# Patient Record
Sex: Female | Born: 1965 | Race: White | Hispanic: No | Smoking: Never smoker
Health system: Southern US, Community
[De-identification: ages and names within clinical notes are randomized; demographics above are authoritative.]

## PROBLEM LIST (undated history)

## (undated) DIAGNOSIS — K219 Gastro-esophageal reflux disease without esophagitis: Secondary | ICD-10-CM

## (undated) DIAGNOSIS — F419 Anxiety disorder, unspecified: Secondary | ICD-10-CM

## (undated) DIAGNOSIS — R112 Nausea with vomiting, unspecified: Secondary | ICD-10-CM

## (undated) DIAGNOSIS — E538 Deficiency of other specified B group vitamins: Secondary | ICD-10-CM

## (undated) DIAGNOSIS — T8859XA Other complications of anesthesia, initial encounter: Secondary | ICD-10-CM

## (undated) DIAGNOSIS — T4145XA Adverse effect of unspecified anesthetic, initial encounter: Secondary | ICD-10-CM

## (undated) DIAGNOSIS — Z9889 Other specified postprocedural states: Secondary | ICD-10-CM

## (undated) DIAGNOSIS — M1990 Unspecified osteoarthritis, unspecified site: Secondary | ICD-10-CM

## (undated) HISTORY — PX: NASAL SINUS SURGERY: SHX719

## (undated) HISTORY — PX: THYROID CYST EXCISION: SHX2511

## (undated) HISTORY — PX: UTERINE FIBROID SURGERY: SHX826

## (undated) HISTORY — DX: Deficiency of other specified B group vitamins: E53.8

---

## 2013-06-11 DIAGNOSIS — R3989 Other symptoms and signs involving the genitourinary system: Secondary | ICD-10-CM | POA: Insufficient documentation

## 2013-11-06 ENCOUNTER — Ambulatory Visit: Payer: Self-pay | Admitting: Physical Medicine and Rehabilitation

## 2015-03-16 DIAGNOSIS — E538 Deficiency of other specified B group vitamins: Secondary | ICD-10-CM | POA: Insufficient documentation

## 2015-08-10 ENCOUNTER — Other Ambulatory Visit: Payer: Self-pay | Admitting: Physical Medicine and Rehabilitation

## 2015-08-10 DIAGNOSIS — M5416 Radiculopathy, lumbar region: Secondary | ICD-10-CM

## 2015-08-18 ENCOUNTER — Ambulatory Visit
Admission: RE | Admit: 2015-08-18 | Discharge: 2015-08-18 | Disposition: A | Discharge status: Home / Self Care | Payer: BLUE CROSS/BLUE SHIELD | Source: Ambulatory Visit | Attending: Physical Medicine and Rehabilitation | Admitting: Physical Medicine and Rehabilitation

## 2015-08-18 DIAGNOSIS — M5126 Other intervertebral disc displacement, lumbar region: Secondary | ICD-10-CM | POA: Insufficient documentation

## 2015-08-18 DIAGNOSIS — M4696 Unspecified inflammatory spondylopathy, lumbar region: Secondary | ICD-10-CM | POA: Insufficient documentation

## 2015-08-18 DIAGNOSIS — M5416 Radiculopathy, lumbar region: Secondary | ICD-10-CM | POA: Insufficient documentation

## 2016-03-31 DIAGNOSIS — R7989 Other specified abnormal findings of blood chemistry: Secondary | ICD-10-CM | POA: Insufficient documentation

## 2016-06-30 DIAGNOSIS — N3281 Overactive bladder: Secondary | ICD-10-CM | POA: Insufficient documentation

## 2016-07-02 DIAGNOSIS — R3129 Other microscopic hematuria: Secondary | ICD-10-CM | POA: Insufficient documentation

## 2017-08-01 DIAGNOSIS — M5136 Other intervertebral disc degeneration, lumbar region: Secondary | ICD-10-CM | POA: Insufficient documentation

## 2018-03-12 ENCOUNTER — Other Ambulatory Visit: Payer: Self-pay | Admitting: Orthopedic Surgery

## 2018-03-12 DIAGNOSIS — M25561 Pain in right knee: Secondary | ICD-10-CM

## 2018-03-21 ENCOUNTER — Ambulatory Visit
Admission: RE | Admit: 2018-03-21 | Discharge: 2018-03-21 | Disposition: A | Discharge status: Home / Self Care | Payer: BLUE CROSS/BLUE SHIELD | Source: Ambulatory Visit | Attending: Orthopedic Surgery | Admitting: Orthopedic Surgery

## 2018-03-21 DIAGNOSIS — M1711 Unilateral primary osteoarthritis, right knee: Secondary | ICD-10-CM | POA: Insufficient documentation

## 2018-03-21 DIAGNOSIS — M25561 Pain in right knee: Secondary | ICD-10-CM | POA: Diagnosis not present

## 2018-05-23 ENCOUNTER — Other Ambulatory Visit: Payer: Self-pay

## 2018-05-23 ENCOUNTER — Encounter
Admission: RE | Admit: 2018-05-23 | Discharge: 2018-05-23 | Disposition: A | Discharge status: Home / Self Care | Payer: BLUE CROSS/BLUE SHIELD | Source: Ambulatory Visit | Attending: Orthopedic Surgery | Admitting: Orthopedic Surgery

## 2018-05-23 HISTORY — DX: Other complications of anesthesia, initial encounter: T88.59XA

## 2018-05-23 HISTORY — DX: Nausea with vomiting, unspecified: R11.2

## 2018-05-23 HISTORY — DX: Gastro-esophageal reflux disease without esophagitis: K21.9

## 2018-05-23 HISTORY — DX: Other specified postprocedural states: Z98.890

## 2018-05-23 HISTORY — DX: Unspecified osteoarthritis, unspecified site: M19.90

## 2018-05-23 HISTORY — DX: Adverse effect of unspecified anesthetic, initial encounter: T41.45XA

## 2018-05-23 HISTORY — DX: Anxiety disorder, unspecified: F41.9

## 2018-05-23 NOTE — Patient Instructions (Signed)
Your procedure is scheduled on: 05-30-18  Report to Same Day Surgery 2nd floor medical mall [facility] Entrance-take elevator on left to 2nd floor.  Check in with surgery information desk.) To find out your arrival time please call [phone] between 1PM - 3PM on 05-29-18  Remember: Instructions that are not followed completely may result in serious medical risk, up to and including death, or upon the discretion of your surgeon and anesthesiologist your surgery may need to be rescheduled.    _x___ 1. Do not eat food after midnight the night before your procedure. You may drink clear liquids up to 2 hours before you are scheduled to arrive at the hospital for your procedure.  Do not drink clear liquids within 2 hours of your scheduled arrival to the hospital.  Clear liquids include  --Water or Apple juice without pulp  --Clear carbohydrate beverage such as ClearFast or Gatorade  --Black Coffee or Clear Tea (No milk, no creamers, do not add anything to the coffee or Tea   No gum chewing or hard candies.     __x__ 2. No Alcohol for 24 hours before or after surgery.   __x__3. No Smoking or e-cigarettes for 24 prior to surgery.  Do not use any chewable tobacco products for at least 6 hour prior to surgery   ____  4. Bring all medications with you on the day of surgery if instructed.    __x__ 5. Notify your doctor if there is any change in your medical condition     (cold, fever, infections).    x___6. On the morning of surgery brush your teeth with toothpaste and water.  You may rinse your mouth with mouth wash if you wish.  Do not swallow any toothpaste or mouthwash.   Do not wear jewelry, make-up, hairpins, clips or nail polish.  Do not wear lotions, powders, or perfumes. You may wear deodorant.  Do not shave 48 hours prior to surgery. Men may shave face and neck.  Do not bring valuables to the hospital.    [facility] is not responsible for any belongings or valuables.      Contacts, dentures or bridgework may not be worn into surgery.  Leave your suitcase in the car. After surgery it may be brought to your room.  For patients admitted to the hospital, discharge time is determined by your treatment team.  _  Patients discharged the day of surgery will not be allowed to drive home.  You will need someone to drive you home and stay with you the night of your procedure.    Please read over the following fact sheets that you were given:   [facility] Preparing for Surgery and or MRSA Information   _x___ TAKE THE FOLLOWING MEDICATION THE MORNING OF SURGERY WITH A SMALL SIP OF WATER. These include:  1. WELLBUTRIN  2. ZANTAC  3.TAKE A ZANTAC THE NIGHT BEFORE YOUR SURGERY  4.  5.  6.  ____Fleets enema or Magnesium Citrate as directed.   ____ Use CHG Soap or sage wipes as directed on instruction sheet   ____ Use inhalers on the day of surgery and bring to hospital day of surgery  ____ Stop Metformin and Janumet 2 days prior to surgery.    ____ Take 1/2 of usual insulin dose the night before surgery and none on the morning surgery.   ____ Follow recommendations from Cardiologist, Pulmonologist or PCP regarding stopping Aspirin, Coumadin, Plavix ,Eliquis, Effient, or Pradaxa, and Pletal.  X____Stop Anti-inflammatories such as Advil, Aleve, Ibuprofen, Motrin, Naproxen, Naprosyn, Goodies powders or aspirin products NOW-OK to take Tylenol    _x___ Stop supplements until after surgery-STOP PHENTERMINE AND MELATONIN NOW-MAY RESUME AFTER SURGERY   ____ Bring C-Pap to the hospital.

## 2018-05-29 ENCOUNTER — Encounter: Payer: Self-pay | Admitting: *Deleted

## 2018-05-29 MED ORDER — CEFAZOLIN SODIUM-DEXTROSE 2-4 GM/100ML-% IV SOLN
2.0000 g | Freq: Once | INTRAVENOUS | Status: AC
  Administered 2018-05-30: 2 g via INTRAVENOUS

## 2018-05-30 ENCOUNTER — Ambulatory Visit: Payer: BLUE CROSS/BLUE SHIELD | Admitting: Certified Registered"

## 2018-05-30 ENCOUNTER — Encounter
Admission: RE | Disposition: A | Discharge status: Home / Self Care | Payer: Self-pay | Source: Ambulatory Visit | Attending: Orthopedic Surgery

## 2018-05-30 ENCOUNTER — Ambulatory Visit
Admission: RE | Admit: 2018-05-30 | Discharge: 2018-05-30 | Disposition: A | Discharge status: Home / Self Care | Payer: BLUE CROSS/BLUE SHIELD | Source: Ambulatory Visit | Attending: Orthopedic Surgery | Admitting: Orthopedic Surgery

## 2018-05-30 DIAGNOSIS — Z79899 Other long term (current) drug therapy: Secondary | ICD-10-CM | POA: Diagnosis not present

## 2018-05-30 DIAGNOSIS — S83281A Other tear of lateral meniscus, current injury, right knee, initial encounter: Secondary | ICD-10-CM | POA: Insufficient documentation

## 2018-05-30 DIAGNOSIS — Y929 Unspecified place or not applicable: Secondary | ICD-10-CM | POA: Insufficient documentation

## 2018-05-30 DIAGNOSIS — M1711 Unilateral primary osteoarthritis, right knee: Secondary | ICD-10-CM | POA: Diagnosis not present

## 2018-05-30 DIAGNOSIS — S83241A Other tear of medial meniscus, current injury, right knee, initial encounter: Secondary | ICD-10-CM | POA: Diagnosis not present

## 2018-05-30 DIAGNOSIS — X58XXXA Exposure to other specified factors, initial encounter: Secondary | ICD-10-CM | POA: Diagnosis not present

## 2018-05-30 DIAGNOSIS — E538 Deficiency of other specified B group vitamins: Secondary | ICD-10-CM | POA: Diagnosis not present

## 2018-05-30 DIAGNOSIS — M5136 Other intervertebral disc degeneration, lumbar region: Secondary | ICD-10-CM | POA: Insufficient documentation

## 2018-05-30 HISTORY — PX: KNEE ARTHROSCOPY WITH MEDIAL MENISECTOMY: SHX5651

## 2018-05-30 LAB — POCT PREGNANCY, URINE: Preg Test, Ur: NEGATIVE

## 2018-05-30 SURGERY — ARTHROSCOPY, KNEE, WITH MEDIAL MENISCECTOMY
Anesthesia: General | Site: Knee | Laterality: Right | Wound class: "Clean "

## 2018-05-30 MED ORDER — FENTANYL CITRATE (PF) 100 MCG/2ML IJ SOLN
25.0000 ug | INTRAMUSCULAR | Status: AC | PRN
  Administered 2018-05-30 (×6): 25 ug via INTRAVENOUS

## 2018-05-30 MED ORDER — SCOPOLAMINE 1 MG/3DAYS TD PT72
1.0000 | MEDICATED_PATCH | TRANSDERMAL | Status: DC
  Administered 2018-05-30: 1.5 mg via TRANSDERMAL

## 2018-05-30 MED ORDER — FENTANYL CITRATE (PF) 100 MCG/2ML IJ SOLN
INTRAMUSCULAR | Status: AC
  Administered 2018-05-30: 25 ug via INTRAVENOUS
  Filled 2018-05-30: qty 2

## 2018-05-30 MED ORDER — HYDROCODONE-ACETAMINOPHEN 5-325 MG PO TABS
ORAL_TABLET | ORAL | Status: AC
  Filled 2018-05-30: qty 1

## 2018-05-30 MED ORDER — MIDAZOLAM HCL 5 MG/5ML IJ SOLN
INTRAMUSCULAR | Status: DC | PRN
  Administered 2018-05-30: 2 mg via INTRAVENOUS

## 2018-05-30 MED ORDER — SCOPOLAMINE 1 MG/3DAYS TD PT72
MEDICATED_PATCH | TRANSDERMAL | Status: AC
  Administered 2018-05-30: 1.5 mg via TRANSDERMAL
  Filled 2018-05-30: qty 1

## 2018-05-30 MED ORDER — GLYCOPYRROLATE 0.2 MG/ML IJ SOLN
INTRAMUSCULAR | Status: AC
  Filled 2018-05-30: qty 1

## 2018-05-30 MED ORDER — PROPOFOL 10 MG/ML IV BOLUS
INTRAVENOUS | Status: DC | PRN
  Administered 2018-05-30 (×2): 200 mg via INTRAVENOUS

## 2018-05-30 MED ORDER — CEFAZOLIN SODIUM-DEXTROSE 2-4 GM/100ML-% IV SOLN
INTRAVENOUS | Status: AC
  Filled 2018-05-30: qty 100

## 2018-05-30 MED ORDER — DIPHENHYDRAMINE HCL 50 MG/ML IJ SOLN
INTRAMUSCULAR | Status: AC
  Filled 2018-05-30: qty 1

## 2018-05-30 MED ORDER — FENTANYL CITRATE (PF) 100 MCG/2ML IJ SOLN
INTRAMUSCULAR | Status: AC
  Filled 2018-05-30: qty 2

## 2018-05-30 MED ORDER — ONDANSETRON HCL 4 MG/2ML IJ SOLN
INTRAMUSCULAR | Status: DC | PRN
  Administered 2018-05-30: 4 mg via INTRAVENOUS

## 2018-05-30 MED ORDER — HYDROCODONE-ACETAMINOPHEN 5-325 MG PO TABS: 1.0000 | ORAL_TABLET | Freq: Four times a day (QID) | ORAL | 0 refills | Status: DC | PRN

## 2018-05-30 MED ORDER — HYDROCODONE-ACETAMINOPHEN 5-325 MG PO TABS
1.0000 | ORAL_TABLET | Freq: Once | ORAL | Status: AC
  Administered 2018-05-30: 1 via ORAL

## 2018-05-30 MED ORDER — LIDOCAINE HCL (PF) 2 % IJ SOLN
INTRAMUSCULAR | Status: DC | PRN
  Administered 2018-05-30: 50 mg

## 2018-05-30 MED ORDER — DEXAMETHASONE SODIUM PHOSPHATE 10 MG/ML IJ SOLN
INTRAMUSCULAR | Status: AC
  Filled 2018-05-30: qty 1

## 2018-05-30 MED ORDER — ONDANSETRON HCL 4 MG/2ML IJ SOLN
INTRAMUSCULAR | Status: AC
Start: 2018-05-30 — End: ?
  Filled 2018-05-30: qty 2

## 2018-05-30 MED ORDER — LACTATED RINGERS IV SOLN
INTRAVENOUS | Status: DC
  Administered 2018-05-30: 10:00:00 via INTRAVENOUS

## 2018-05-30 MED ORDER — DEXAMETHASONE SODIUM PHOSPHATE 10 MG/ML IJ SOLN
INTRAMUSCULAR | Status: DC | PRN
  Administered 2018-05-30: 10 mg via INTRAVENOUS

## 2018-05-30 MED ORDER — GLYCOPYRROLATE 0.2 MG/ML IJ SOLN
INTRAMUSCULAR | Status: DC | PRN
  Administered 2018-05-30: 0.2 mg via INTRAVENOUS

## 2018-05-30 MED ORDER — MIDAZOLAM HCL 2 MG/2ML IJ SOLN
INTRAMUSCULAR | Status: AC
  Filled 2018-05-30: qty 2

## 2018-05-30 MED ORDER — ONDANSETRON HCL 4 MG/2ML IJ SOLN: 4.0000 mg | Freq: Once | INTRAMUSCULAR | Status: DC | PRN

## 2018-05-30 MED ORDER — FENTANYL CITRATE (PF) 100 MCG/2ML IJ SOLN
INTRAMUSCULAR | Status: DC | PRN
  Administered 2018-05-30 (×2): 50 ug via INTRAVENOUS

## 2018-05-30 MED ORDER — KETOROLAC TROMETHAMINE 30 MG/ML IJ SOLN
INTRAMUSCULAR | Status: DC | PRN
  Administered 2018-05-30: 30 mg via INTRAVENOUS

## 2018-05-30 MED ORDER — BUPIVACAINE-EPINEPHRINE (PF) 0.5% -1:200000 IJ SOLN
INTRAMUSCULAR | Status: DC | PRN
  Administered 2018-05-30: 10 mL via PERINEURAL

## 2018-05-30 SURGICAL SUPPLY — 30 items
ABLATOR ASPIRATE 50D MULTI-PRT (SURGICAL WAND) ×2 IMPLANT
BANDAGE ACE 4X5 VEL STRL LF (GAUZE/BANDAGES/DRESSINGS) ×2 IMPLANT
BLADE INCISOR PLUS 4.5 (BLADE) IMPLANT
CHLORAPREP W/TINT 26ML (MISCELLANEOUS) ×3 IMPLANT
CUFF TOURN 24 STER (MISCELLANEOUS) ×2 IMPLANT
CUFF TOURN 30 STER DUAL PORT (MISCELLANEOUS) ×2 IMPLANT
DW OUTFLOW CASSETTE/TUBE SET (MISCELLANEOUS) ×2 IMPLANT
GAUZE SPONGE 4X4 12PLY STRL (GAUZE/BANDAGES/DRESSINGS) ×3 IMPLANT
GLOVE SURG SYN 9.0  PF PI (GLOVE) ×2
GLOVE SURG SYN 9.0 PF PI (GLOVE) ×1 IMPLANT
GOWN SRG 2XL LVL 4 RGLN SLV (GOWNS) ×1 IMPLANT
GOWN STRL NON-REIN 2XL LVL4 (GOWNS) ×2
GOWN STRL REUS W/ TWL LRG LVL3 (GOWN DISPOSABLE) ×2 IMPLANT
GOWN STRL REUS W/TWL LRG LVL3 (GOWN DISPOSABLE) ×4
IV LACTATED RINGER IRRG 3000ML (IV SOLUTION) ×4
IV LR IRRIG 3000ML ARTHROMATIC (IV SOLUTION) ×2 IMPLANT
KIT TURNOVER KIT A (KITS) ×3 IMPLANT
MANIFOLD NEPTUNE II (INSTRUMENTS) ×3 IMPLANT
NEEDLE HYPO 22GX1.5 SAFETY (NEEDLE) ×3 IMPLANT
PACK ARTHROSCOPY KNEE (MISCELLANEOUS) ×3 IMPLANT
SCALPEL PROTECTED #11 DISP (BLADE) ×3 IMPLANT
SET TUBE SUCT SHAVER OUTFL 24K (TUBING) ×1 IMPLANT
SET TUBE TIP INTRA-ARTICULAR (MISCELLANEOUS) ×1 IMPLANT
SUT ETHILON 4-0 (SUTURE) ×2
SUT ETHILON 4-0 FS2 18XMFL BLK (SUTURE) ×1
SUTURE ETHLN 4-0 FS2 18XMF BLK (SUTURE) ×1 IMPLANT
TUBING ARTHRO INFLOW-ONLY STRL (TUBING) ×1 IMPLANT
TUBING REDEUCE PAT W/CON 8IN (MISCELLANEOUS) ×2 IMPLANT
TUBING REDEUCE PUMP W/CON 8IN (MISCELLANEOUS) ×2 IMPLANT
WAND COBLATION FLOW 50 (SURGICAL WAND) ×1 IMPLANT

## 2018-05-30 NOTE — Anesthesia Post-op Follow-up Note (Signed)
Anesthesia QCDR form completed.        

## 2018-05-30 NOTE — Transfer of Care (Signed)
Immediate Anesthesia Transfer of Care Note  Patient: Kelsey Short  Procedure(s) Performed: KNEE ARTHROSCOPY WITH MEDIAL MENISECTOMY PARTIAL SYNOVECTOMY (Right Knee)  Patient Location: PACU  Anesthesia Type:General  Level of Consciousness: sedated  Airway & Oxygen Therapy: Patient Spontanous Breathing and Patient connected to face mask oxygen  Post-op Assessment: Report given to RN and Post -op Vital signs reviewed and stable  Post vital signs: Reviewed  Last Vitals:  Vitals Value Taken Time  BP 107/70 05/30/2018 11:34 AM  Temp 36.1 C 05/30/2018 11:34 AM  Pulse 84 05/30/2018 11:34 AM  Resp 14 05/30/2018 11:34 AM  SpO2 97 % 05/30/2018 11:34 AM    Last Pain:  Vitals:   05/30/18 0939  TempSrc: Temporal  PainSc: 3          Complications: No apparent anesthesia complications

## 2018-05-30 NOTE — Op Note (Signed)
05/30/2018  11:31 AM  PATIENT:  Kelsey Short  52 y.o. female  PRE-OPERATIVE DIAGNOSIS:  DERANGEMENT OF POSTERIOR HORN OF MEDIAL MENISCUS  POST-OPERATIVE DIAGNOSIS:  DERANGEMENT OF POSTERIOR HORN OF MEDIAL MENISCUS, anterior horn tear lateral meniscus and plica band, degenerative arthritis  PROCEDURE:  Procedure(s): KNEE ARTHROSCOPY WITH MEDIAL MENISECTOMY PARTIAL SYNOVECTOMY (Right) partial lateral meniscectomy  SURGEON: Laurene Footman, MD  ASSISTANTS: None  ANESTHESIA:   general  EBL:  Total I/O In: 600 [I.V.:600] Out: -   BLOOD ADMINISTERED:none  DRAINS: none   LOCAL MEDICATIONS USED:  MARCAINE     SPECIMEN:  No Specimen  DISPOSITION OF SPECIMEN:  N/A  COUNTS:  YES  TOURNIQUET:   Total Tourniquet Time Documented: Thigh (Right) - 31 minutes Total: Thigh (Right) - 31 minutes   IMPLANTS: None  DICTATION: .Dragon Dictation patient was brought to the operating room and after adequate anesthesia was obtained the right leg was prepped and draped you sterile fashion with a tourniquet and arthroscopic leg holder applied.  After patient identification and timeout procedures were completed inferior lateral portal was made with tourniquet elevated.  Initial inspection revealed significant chondromalacia of the patella particularly in the central aspect and medial facet with several small loose fragments of articular cartilage not large enough to be considered loose bodies.  There is mild synovitis within the knee and a thick plica band medially coming on the medial compartment the medial femoral condyle had significant articular loss with some fissuring to the bone.  No exposed bone.  There was a flap tear of the posterior horn of the meniscus which was subsequently ablated with the wand.  There was a very thickened ligamentum mucosa and moderate synovitis in the notch so this was ablated as well.  The lateral compartment the articular cartilage was near normal but there were  small tears to the anterior horn and a small flap tear of the posterior which did appear to impinge into the joint was told these also were ablated.  After the meniscus pathology was addressed the gutters were checked and there were no loose bodies or plica band was ablated at this time with pre-and post procedure check pictures obtained the knee was thoroughly irrigated all instrumentation withdrawn.  Wounds were closed with simple 4-0 nylon and the knee incisions were infiltrated with a total of 20 cc half percent Sensorcaine with epinephrine for postop analgesia.  Xeroform 4 x 4 web roll and Ace wrap applied.  PLAN OF CARE: Discharge to home after PACU  PATIENT DISPOSITION:  PACU - hemodynamically stable.

## 2018-05-30 NOTE — Anesthesia Preprocedure Evaluation (Signed)
Anesthesia Evaluation  Patient identified by MRN, date of birth, ID band Patient awake    Reviewed: Allergy & Precautions, NPO status , Patient's Chart, lab work & pertinent test results  History of Anesthesia Complications (+) PONV and history of anesthetic complications  Airway Mallampati: II  TM Distance: >3 FB     Dental  (+) Caps, Teeth Intact   Pulmonary neg pulmonary ROS,    Pulmonary exam normal        Cardiovascular negative cardio ROS Normal cardiovascular exam     Neuro/Psych Anxiety negative neurological ROS     GI/Hepatic Neg liver ROS, GERD  Medicated and Controlled,  Endo/Other  negative endocrine ROS  Renal/GU negative Renal ROS     Musculoskeletal  (+) Arthritis , Osteoarthritis,    Abdominal Normal abdominal exam  (+)   Peds  Hematology negative hematology ROS (+)   Anesthesia Other Findings   Reproductive/Obstetrics                             Anesthesia Physical Anesthesia Plan  ASA: II  Anesthesia Plan: General   Post-op Pain Management:    Induction: Intravenous  PONV Risk Score and Plan: 4 or greater  Airway Management Planned: LMA  Additional Equipment:   Intra-op Plan:   Post-operative Plan: Extubation in OR  Informed Consent: I have reviewed the patients History and Physical, chart, labs and discussed the procedure including the risks, benefits and alternatives for the proposed anesthesia with the patient or authorized representative who has indicated his/her understanding and acceptance.   Dental advisory given  Plan Discussed with: CRNA and Surgeon  Anesthesia Plan Comments:         Anesthesia Quick Evaluation

## 2018-05-30 NOTE — H&P (Signed)
Reviewed paper H+P, will be scanned into chart. No changes noted.  

## 2018-05-30 NOTE — Anesthesia Procedure Notes (Signed)
Procedure Name: LMA Insertion Performed by: Debany Vantol, CRNA Pre-anesthesia Checklist: Patient identified, Patient being monitored, Timeout performed, Emergency Drugs available and Suction available Patient Re-evaluated:Patient Re-evaluated prior to induction Oxygen Delivery Method: Circle system utilized Preoxygenation: Pre-oxygenation with 100% oxygen Induction Type: IV induction Ventilation: Mask ventilation without difficulty LMA: LMA inserted LMA Size: 4.0 Tube type: Oral Number of attempts: 2 Placement Confirmation: positive ETCO2 and breath sounds checked- equal and bilateral Tube secured with: Tape Dental Injury: Teeth and Oropharynx as per pre-operative assessment        

## 2018-05-30 NOTE — Discharge Instructions (Addendum)
Weightbearing as tolerated on right leg, try to minimize activity through the weekend.  Aspirin 325 mg daily for 1 month.  Medicine as directed.  Keep dressing clean and dry  AMBULATORY SURGERY  DISCHARGE INSTRUCTIONS   1) The drugs that you were given will stay in your system until tomorrow so for the next 24 hours you should not:  A) Drive an automobile B) Make any legal decisions C) Drink any alcoholic beverage   2) You may resume regular meals tomorrow.  Today it is better to start with liquids and gradually work up to solid foods.  You may eat anything you prefer, but it is better to start with liquids, then soup and crackers, and gradually work up to solid foods.   3) Please notify your doctor immediately if you have any unusual bleeding, trouble breathing, redness and pain at the surgery site, drainage, fever, or pain not relieved by medication.    4) Additional Instructions:        Please contact your physician with any problems or Same Day Surgery at [phone], Monday through Friday 6 am to 4 pm, or [place] at [facility] number at [phone].

## 2018-05-31 NOTE — Anesthesia Postprocedure Evaluation (Signed)
Anesthesia Post Note  Patient: Kelsey Short  Procedure(s) Performed: KNEE ARTHROSCOPY WITH MEDIAL MENISECTOMY PARTIAL SYNOVECTOMY (Right Knee)  Patient location during evaluation: PACU Anesthesia Type: General Level of consciousness: awake and alert and oriented Pain management: pain level controlled Vital Signs Assessment: post-procedure vital signs reviewed and stable Respiratory status: spontaneous breathing Cardiovascular status: blood pressure returned to baseline Anesthetic complications: no     Last Vitals:  Vitals:   05/30/18 1255 05/30/18 1301  BP: (!) 148/76 (!) 145/70  Pulse: 80 80  Resp: 15 16  Temp:  36.8 C  SpO2: 98% 98%    Last Pain:  Vitals:   05/31/18 0844  TempSrc:   PainSc: 2                  Evadean Sproule

## 2018-06-06 ENCOUNTER — Other Ambulatory Visit: Payer: BLUE CROSS/BLUE SHIELD

## 2018-08-06 ENCOUNTER — Other Ambulatory Visit: Payer: Self-pay | Admitting: Internal Medicine

## 2018-08-06 DIAGNOSIS — M1711 Unilateral primary osteoarthritis, right knee: Secondary | ICD-10-CM | POA: Insufficient documentation

## 2018-08-06 DIAGNOSIS — Z1231 Encounter for screening mammogram for malignant neoplasm of breast: Secondary | ICD-10-CM

## 2018-09-11 DIAGNOSIS — Z8262 Family history of osteoporosis: Secondary | ICD-10-CM | POA: Insufficient documentation

## 2019-01-10 DIAGNOSIS — M25561 Pain in right knee: Secondary | ICD-10-CM | POA: Insufficient documentation

## 2019-03-04 DIAGNOSIS — M179 Osteoarthritis of knee, unspecified: Secondary | ICD-10-CM | POA: Insufficient documentation

## 2019-03-04 DIAGNOSIS — M1710 Unilateral primary osteoarthritis, unspecified knee: Secondary | ICD-10-CM | POA: Insufficient documentation

## 2019-09-16 ENCOUNTER — Other Ambulatory Visit: Payer: Self-pay

## 2019-09-16 DIAGNOSIS — R3129 Other microscopic hematuria: Secondary | ICD-10-CM

## 2019-09-19 ENCOUNTER — Ambulatory Visit: Payer: Self-pay | Admitting: Urology

## 2019-09-19 ENCOUNTER — Encounter

## 2019-10-08 ENCOUNTER — Ambulatory Visit: Payer: Self-pay | Admitting: Urology

## 2019-10-21 DIAGNOSIS — M1711 Unilateral primary osteoarthritis, right knee: Secondary | ICD-10-CM | POA: Diagnosis not present

## 2019-11-04 ENCOUNTER — Other Ambulatory Visit: Payer: Self-pay

## 2019-11-04 DIAGNOSIS — R3129 Other microscopic hematuria: Secondary | ICD-10-CM

## 2019-11-07 ENCOUNTER — Other Ambulatory Visit: Payer: Self-pay

## 2019-11-07 ENCOUNTER — Ambulatory Visit (INDEPENDENT_AMBULATORY_CARE_PROVIDER_SITE_OTHER): Payer: 59 | Admitting: Urology

## 2019-11-07 ENCOUNTER — Other Ambulatory Visit
Admission: RE | Admit: 2019-11-07 | Discharge: 2019-11-07 | Disposition: A | Discharge status: Home / Self Care | Payer: 59 | Attending: Urology | Admitting: Urology

## 2019-11-07 ENCOUNTER — Encounter: Payer: Self-pay | Admitting: Urology

## 2019-11-07 VITALS — BP 115/71 | HR 81 | Ht 74.0 in | Wt 187.0 lb

## 2019-11-07 DIAGNOSIS — R3129 Other microscopic hematuria: Secondary | ICD-10-CM | POA: Diagnosis not present

## 2019-11-07 LAB — URINALYSIS, COMPLETE (UACMP) WITH MICROSCOPIC
Bacteria, UA: NONE SEEN
Bilirubin Urine: NEGATIVE
Glucose, UA: NEGATIVE mg/dL
Ketones, ur: NEGATIVE mg/dL
Leukocytes,Ua: NEGATIVE
Nitrite: NEGATIVE
Protein, ur: NEGATIVE mg/dL
Specific Gravity, Urine: 1.025 (ref 1.005–1.030)
Squamous Epithelial / LPF: NONE SEEN (ref 0–5)
WBC, UA: NONE SEEN WBC/hpf (ref 0–5)
pH: 6 (ref 5.0–8.0)

## 2019-11-07 NOTE — Progress Notes (Signed)
11/07/2019 2:30 PM   Lucinda Dell [birth year] [id number]  Referring provider: Rusty Aus, MD [place] [facility] [address],  [place] [address]  Chief Complaint  Patient presents with  . Hematuria    HPI: 54 year old female who presents today for further evaluation of microscopic hematuria.  She was noted to have microscopic blood on multiple occasions ranging anywhere from 4-10 up to 10-50 red blood cells per high-power field in various urinalyses.  These have been from 08/20/2019 as well as 08/2019.  She is back scopic blood in her urine today as well.    She did have some microscopic blood in her urine back in 2007.  To the multiple additional urinalysis over the past several years since 2016 none of which turned blue.  This is a new finding.  She denies any gross hematuria.  No baseline urinary symptoms.  No urgency frequency.  No incontinence.  She denies any flank pain.  No history of kidney stones.  She does have a family history of bladder cancer in her father.  He was a smoker.  She is a never smoker.   PMH: Past Medical History:  Diagnosis Date  . Anxiety   . Arthritis   . Complication of anesthesia   . GERD (gastroesophageal reflux disease)   . PONV (postoperative nausea and vomiting)    NAUSEA    Surgical History: Past Surgical History:  Procedure Laterality Date  . KNEE ARTHROSCOPY WITH MEDIAL MENISECTOMY Right 05/30/2018   Procedure: KNEE ARTHROSCOPY WITH MEDIAL MENISECTOMY PARTIAL SYNOVECTOMY;  Surgeon: Hessie Knows, MD;  Location: [facility] ORS;  Service: Orthopedics;  Laterality: Right;  . NASAL SINUS SURGERY    . THYROID CYST EXCISION    . UTERINE FIBROID SURGERY      Home Medications:  Allergies as of 11/07/2019      Reactions   Oxycodone Nausea Only      Medication List       Accurate as of November 07, 2019 11:59 PM. If you have any questions, ask your nurse or doctor.        STOP taking these  medications   buPROPion 150 MG 12 hr tablet Commonly known as: WELLBUTRIN SR Stopped by: Hollice Espy, MD   phentermine 15 MG capsule Stopped by: Hollice Espy, MD   Uro-MP 118 MG Caps Stopped by: Hollice Espy, MD     TAKE these medications   acetaminophen 650 MG CR tablet Commonly known as: TYLENOL Take 650 mg by mouth 2 (two) times daily as needed for pain.   escitalopram 20 MG tablet Commonly known as: LEXAPRO Take 20 mg by mouth every evening.   HYDROcodone-acetaminophen 5-325 MG tablet Commonly known as: Norco Take 1 tablet by mouth every 6 (six) hours as needed for moderate pain.   Melatonin 10 MG Tabs Take 10 mg by mouth daily as needed (sleep).   naproxen 500 MG tablet Commonly known as: NAPROSYN Take 500 mg by mouth 2 (two) times daily as needed for pain.   ranitidine 150 MG tablet Commonly known as: ZANTAC Take 150 mg by mouth daily as needed for heartburn.       Allergies:  Allergies  Allergen Reactions  . Oxycodone Nausea Only    Family History: Family History  Problem Relation Age of Onset  . Bladder Cancer Father   . Prostate cancer Paternal Uncle   . Prostate cancer Paternal Uncle   . Prostate cancer Paternal Grandfather     Social  History:  reports that she has never smoked. She has never used smokeless tobacco. She reports that she does not drink alcohol or use drugs.  ROS: UROLOGY Frequent Urination?: No Hard to postpone urination?: No Burning/pain with urination?: No Get up at night to urinate?: No Leakage of urine?: No Urine stream starts and stops?: No Trouble starting stream?: No Do you have to strain to urinate?: No Blood in urine?: Yes Urinary tract infection?: No Sexually transmitted disease?: No Injury to kidneys or bladder?: No Painful intercourse?: No Weak stream?: No Currently pregnant?: No Vaginal bleeding?: No  Gastrointestinal Nausea?: No Vomiting?: No Indigestion/heartburn?: No Diarrhea?: No  Constipation?: No  Constitutional Fever: No Night sweats?: No Weight loss?: No Fatigue?: Yes  Skin Skin rash/lesions?: No Itching?: No  Eyes Blurred vision?: No Double vision?: No  Ears/Nose/Throat Sore throat?: No Sinus problems?: No  Hematologic/Lymphatic Swollen glands?: No Easy bruising?: No  Cardiovascular Leg swelling?: No Chest pain?: No  Respiratory Cough?: No Shortness of breath?: No  Endocrine Excessive thirst?: No  Musculoskeletal Back pain?: No Joint pain?: Yes  Neurological Headaches?: No Dizziness?: Yes  Psychologic Depression?: No Anxiety?: No  Physical Exam: BP 115/71   Pulse 81   Ht 6\' 2"  (1.88 m)   Wt 187 lb (84.8 kg)   BMI 24.01 kg/m   Constitutional:  Alert and oriented, No acute distress. HEENT: [place] AT, moist mucus membranes.  Trachea midline, no masses. Cardiovascular: No clubbing, cyanosis, or edema. Respiratory: Normal respiratory effort, no increased work of breathing. GI: Abdomen is soft, nontender, nondistended, no abdominal masses GU: No CVA tenderness Skin: No rashes, bruises or suspicious lesions. Neurologic: Grossly intact, no focal deficits, moving all 4 extremities. Psychiatric: Normal mood and affect.  Laboratory Data: Creatinine 0.7 07/2019  Hemoglobin 13.6  Urinalysis Results for orders placed or performed during the hospital encounter of 11/07/19  Urinalysis, Complete w Microscopic (For [facility] ONLY)  Result Value Ref Range   Color, Urine YELLOW YELLOW   APPearance HAZY (A) CLEAR   Specific Gravity, Urine 1.025 1.005 - 1.030   pH 6.0 5.0 - 8.0   Glucose, UA NEGATIVE NEGATIVE mg/dL   Hgb urine dipstick MODERATE (A) NEGATIVE   Bilirubin Urine NEGATIVE NEGATIVE   Ketones, ur NEGATIVE NEGATIVE mg/dL   Protein, ur NEGATIVE NEGATIVE mg/dL   Nitrite NEGATIVE NEGATIVE   Leukocytes,Ua NEGATIVE NEGATIVE   Squamous Epithelial / LPF NONE SEEN 0 - 5   WBC, UA NONE SEEN 0 - 5 WBC/hpf   RBC / HPF 11-20 0 -  5 RBC/hpf   Bacteria, UA NONE SEEN NONE SEEN   Budding Yeast PRESENT     Pertinent Imaging: No recent cross-sectional imaging  Assessment & Plan:    1. Hematuria, microscopic We discussed the AUA guidelines and restratification for microscopic hematuria  Given that its been in her urine on multiple occasions consistently, is a new finding and the overall degree of hematuria, she does follow to the high risk category.  As such, I recommended a comprehensive work-up including CT urogram as well as cystoscopy.  She is agreeable this plan.  She does have a family history of bladder cancer of which her father succumbed. - CT HEMATURIA WORKUP; Future   Return in about 4 weeks (around 12/05/2019) for CT scan, cysto.  Hollice Espy, MD  [facility] [address] [place], [place] [address] [phone]

## 2019-11-18 ENCOUNTER — Ambulatory Visit
Admission: RE | Admit: 2019-11-18 | Discharge: 2019-11-18 | Disposition: A | Discharge status: Home / Self Care | Payer: 59 | Source: Ambulatory Visit | Attending: Urology | Admitting: Urology

## 2019-11-18 ENCOUNTER — Other Ambulatory Visit: Payer: Self-pay

## 2019-11-18 ENCOUNTER — Telehealth: Payer: Self-pay | Admitting: *Deleted

## 2019-11-18 DIAGNOSIS — R3129 Other microscopic hematuria: Secondary | ICD-10-CM | POA: Diagnosis not present

## 2019-11-18 MED ORDER — IOHEXOL 300 MG/ML  SOLN
125.0000 mL | Freq: Once | INTRAMUSCULAR | Status: AC | PRN
  Administered 2019-11-18: 12:00:00 125 mL via INTRAVENOUS

## 2019-11-18 NOTE — Telephone Encounter (Addendum)
Patient informed, verbalized understanding.    ----- Message from Hollice Espy, MD sent at 11/18/2019  4:34 PM EST ----- CT scan looks fine.  We will review more detail your follow-up.  Hollice Espy, MD

## 2019-12-02 DIAGNOSIS — M1711 Unilateral primary osteoarthritis, right knee: Secondary | ICD-10-CM | POA: Diagnosis not present

## 2019-12-10 ENCOUNTER — Ambulatory Visit (INDEPENDENT_AMBULATORY_CARE_PROVIDER_SITE_OTHER): Payer: 59 | Admitting: Urology

## 2019-12-10 ENCOUNTER — Encounter: Payer: Self-pay | Admitting: Urology

## 2019-12-10 ENCOUNTER — Other Ambulatory Visit: Payer: Self-pay | Admitting: Radiology

## 2019-12-10 ENCOUNTER — Other Ambulatory Visit: Payer: Self-pay

## 2019-12-10 VITALS — BP 125/75 | HR 87 | Ht 74.0 in | Wt 193.0 lb

## 2019-12-10 DIAGNOSIS — N329 Bladder disorder, unspecified: Secondary | ICD-10-CM

## 2019-12-10 DIAGNOSIS — R3129 Other microscopic hematuria: Secondary | ICD-10-CM | POA: Diagnosis not present

## 2019-12-10 LAB — URINALYSIS, COMPLETE
Bilirubin, UA: NEGATIVE
Glucose, UA: NEGATIVE
Ketones, UA: NEGATIVE
Leukocytes,UA: NEGATIVE
Nitrite, UA: NEGATIVE
Protein,UA: NEGATIVE
Specific Gravity, UA: 1.01 (ref 1.005–1.030)
Urobilinogen, Ur: 0.2 mg/dL (ref 0.2–1.0)
pH, UA: 5.5 (ref 5.0–7.5)

## 2019-12-10 LAB — MICROSCOPIC EXAMINATION: Bacteria, UA: NONE SEEN

## 2019-12-10 NOTE — H&P (View-Only) (Signed)
   12/10/19  CC:  Chief Complaint  Patient presents with  . Cysto    HPI: 54 year old female with microscopic hematuria who presents today for cystoscopy.  Since last visit, she underwent CT urogram on 11/18/2019.  This was personally reviewed today.  No GU pathology appreciated.  CT also otherwise unremarkable.  Blood pressure 125/75, pulse 87, height 6\' 2"  (1.88 m), weight 193 lb (87.5 kg). NED. A&Ox3.   No respiratory distress   Abd soft, NT, ND Normal external genitalia with patent urethral meatus  Cystoscopy Procedure Note  Patient identification was confirmed, informed consent was obtained, and patient was prepped using Betadine solution.  Lidocaine jelly was administered per urethral meatus.    Procedure: - Flexible cystoscope introduced, without any difficulty.   - Thorough search of the bladder revealed:    normal urethral meatus    normal urothelium with the exception of approximately 5 mm sessile lesion which is hyperpigmented on the left posterior bladder wall?  (Infectious versus melanocytic versus hemosiderin deposit)    no stones    no ulcers     no tumors    no urethral polyps    no trabeculation  - Ureteral orifices were normal in position and appearance.  Post-Procedure: - Patient tolerated the procedure well  Assessment/ Plan:  1. Hematuria, microscopic S/p cysto with findings as above Cross-sectional imaging negative - Urinalysis, Complete  2. Lesion of bladder Etiology of  hyperpigmented lesion of the bladder is somewhat unclear, may be infectious versus neoplastic versus malignant.  Recommended to proceed to the operating room for resection of this lesion via biopsy and fulguration.  Risk of this were discussed in detail bleeding risk of bleeding, damage surrounding structures, hematuria, amongst others.  All questions answered. - CULTURE, URINE COMPREHENSIVE   Hollice Espy, MD

## 2019-12-10 NOTE — Progress Notes (Signed)
   12/10/19  CC:  Chief Complaint  Patient presents with  . Cysto    HPI: 54 year old female with microscopic hematuria who presents today for cystoscopy.  Since last visit, she underwent CT urogram on 11/18/2019.  This was personally reviewed today.  No GU pathology appreciated.  CT also otherwise unremarkable.  Blood pressure 125/75, pulse 87, height 6\' 2"  (1.88 m), weight 193 lb (87.5 kg). NED. A&Ox3.   No respiratory distress   Abd soft, NT, ND Normal external genitalia with patent urethral meatus  Cystoscopy Procedure Note  Patient identification was confirmed, informed consent was obtained, and patient was prepped using Betadine solution.  Lidocaine jelly was administered per urethral meatus.    Procedure: - Flexible cystoscope introduced, without any difficulty.   - Thorough search of the bladder revealed:    normal urethral meatus    normal urothelium with the exception of approximately 5 mm sessile lesion which is hyperpigmented on the left posterior bladder wall?  (Infectious versus melanocytic versus hemosiderin deposit)    no stones    no ulcers     no tumors    no urethral polyps    no trabeculation  - Ureteral orifices were normal in position and appearance.  Post-Procedure: - Patient tolerated the procedure well  Assessment/ Plan:  1. Hematuria, microscopic S/p cysto with findings as above Cross-sectional imaging negative - Urinalysis, Complete  2. Lesion of bladder Etiology of  hyperpigmented lesion of the bladder is somewhat unclear, may be infectious versus neoplastic versus malignant.  Recommended to proceed to the operating room for resection of this lesion via biopsy and fulguration.  Risk of this were discussed in detail bleeding risk of bleeding, damage surrounding structures, hematuria, amongst others.  All questions answered. - CULTURE, URINE COMPREHENSIVE   Hollice Espy, MD

## 2019-12-11 ENCOUNTER — Other Ambulatory Visit
Admission: RE | Admit: 2019-12-11 | Discharge: 2019-12-11 | Disposition: A | Discharge status: Home / Self Care | Payer: 59 | Source: Ambulatory Visit | Attending: Urology | Admitting: Urology

## 2019-12-11 DIAGNOSIS — Z01812 Encounter for preprocedural laboratory examination: Secondary | ICD-10-CM | POA: Insufficient documentation

## 2019-12-11 DIAGNOSIS — Z20822 Contact with and (suspected) exposure to covid-19: Secondary | ICD-10-CM | POA: Insufficient documentation

## 2019-12-11 LAB — SARS CORONAVIRUS 2 (TAT 6-24 HRS): SARS Coronavirus 2: NEGATIVE

## 2019-12-13 LAB — CULTURE, URINE COMPREHENSIVE

## 2019-12-15 ENCOUNTER — Encounter: Payer: Self-pay | Admitting: Urology

## 2019-12-15 ENCOUNTER — Other Ambulatory Visit: Payer: Self-pay

## 2019-12-15 ENCOUNTER — Ambulatory Visit: Payer: 59 | Admitting: Certified Registered"

## 2019-12-15 ENCOUNTER — Ambulatory Visit
Admission: RE | Admit: 2019-12-15 | Discharge: 2019-12-15 | Disposition: A | Discharge status: Home / Self Care | Payer: 59 | Attending: Urology | Admitting: Urology

## 2019-12-15 ENCOUNTER — Ambulatory Visit: Payer: 59

## 2019-12-15 ENCOUNTER — Encounter
Admission: RE | Disposition: A | Discharge status: Home / Self Care | Payer: Self-pay | Source: Home / Self Care | Attending: Urology

## 2019-12-15 DIAGNOSIS — N329 Bladder disorder, unspecified: Secondary | ICD-10-CM

## 2019-12-15 DIAGNOSIS — D303 Benign neoplasm of bladder: Secondary | ICD-10-CM | POA: Diagnosis not present

## 2019-12-15 DIAGNOSIS — R3129 Other microscopic hematuria: Secondary | ICD-10-CM | POA: Diagnosis present

## 2019-12-15 DIAGNOSIS — N3289 Other specified disorders of bladder: Secondary | ICD-10-CM | POA: Diagnosis not present

## 2019-12-15 HISTORY — PX: CYSTOSCOPY WITH BIOPSY: SHX5122

## 2019-12-15 LAB — POCT PREGNANCY, URINE: Preg Test, Ur: NEGATIVE

## 2019-12-15 SURGERY — CYSTOSCOPY, WITH BIOPSY
Anesthesia: General

## 2019-12-15 MED ORDER — SCOPOLAMINE 1 MG/3DAYS TD PT72: 1.0000 | MEDICATED_PATCH | TRANSDERMAL | Status: DC

## 2019-12-15 MED ORDER — FENTANYL CITRATE (PF) 100 MCG/2ML IJ SOLN
25.0000 ug | INTRAMUSCULAR | Status: AC | PRN
  Administered 2019-12-15 (×4): 25 ug via INTRAVENOUS

## 2019-12-15 MED ORDER — LACTATED RINGERS IV SOLN: INTRAVENOUS | Status: DC

## 2019-12-15 MED ORDER — HYDROCODONE-ACETAMINOPHEN 5-325 MG PO TABS: 1.0000 | ORAL_TABLET | Freq: Once | ORAL | Status: DC | PRN

## 2019-12-15 MED ORDER — FAMOTIDINE 20 MG PO TABS
ORAL_TABLET | ORAL | Status: AC
  Administered 2019-12-15: 20 mg via ORAL
  Filled 2019-12-15: qty 1

## 2019-12-15 MED ORDER — FENTANYL CITRATE (PF) 100 MCG/2ML IJ SOLN
INTRAMUSCULAR | Status: AC
  Administered 2019-12-15: 14:00:00 25 ug via INTRAVENOUS
  Filled 2019-12-15: qty 2

## 2019-12-15 MED ORDER — PROPOFOL 10 MG/ML IV BOLUS
INTRAVENOUS | Status: DC | PRN
  Administered 2019-12-15: 40 mg via INTRAVENOUS
  Administered 2019-12-15: 160 mg via INTRAVENOUS

## 2019-12-15 MED ORDER — FENTANYL CITRATE (PF) 100 MCG/2ML IJ SOLN
INTRAMUSCULAR | Status: DC | PRN
  Administered 2019-12-15: 50 ug via INTRAVENOUS

## 2019-12-15 MED ORDER — CEFAZOLIN SODIUM-DEXTROSE 2-4 GM/100ML-% IV SOLN
INTRAVENOUS | Status: AC
  Filled 2019-12-15: qty 100

## 2019-12-15 MED ORDER — PROMETHAZINE HCL 25 MG/ML IJ SOLN: 6.2500 mg | INTRAMUSCULAR | Status: DC | PRN

## 2019-12-15 MED ORDER — SCOPOLAMINE 1 MG/3DAYS TD PT72
MEDICATED_PATCH | TRANSDERMAL | Status: AC
  Administered 2019-12-15: 1.5 mg via TRANSDERMAL
  Filled 2019-12-15: qty 1

## 2019-12-15 MED ORDER — DEXAMETHASONE SODIUM PHOSPHATE 10 MG/ML IJ SOLN
INTRAMUSCULAR | Status: DC | PRN
  Administered 2019-12-15: 10 mg via INTRAVENOUS

## 2019-12-15 MED ORDER — CEFAZOLIN SODIUM-DEXTROSE 2-4 GM/100ML-% IV SOLN
2.0000 g | INTRAVENOUS | Status: AC
  Administered 2019-12-15: 2 g via INTRAVENOUS

## 2019-12-15 MED ORDER — FAMOTIDINE 20 MG PO TABS: 20.0000 mg | ORAL_TABLET | Freq: Once | ORAL | Status: AC

## 2019-12-15 MED ORDER — ONDANSETRON HCL 4 MG/2ML IJ SOLN
INTRAMUSCULAR | Status: DC | PRN
  Administered 2019-12-15: 4 mg via INTRAVENOUS

## 2019-12-15 MED ORDER — FENTANYL CITRATE (PF) 100 MCG/2ML IJ SOLN
INTRAMUSCULAR | Status: AC
  Administered 2019-12-15: 15:00:00 25 ug via INTRAVENOUS
  Filled 2019-12-15: qty 2

## 2019-12-15 MED ORDER — LIDOCAINE HCL (CARDIAC) PF 100 MG/5ML IV SOSY
PREFILLED_SYRINGE | INTRAVENOUS | Status: DC | PRN
  Administered 2019-12-15: 100 mg via INTRAVENOUS

## 2019-12-15 MED ORDER — MIDAZOLAM HCL 2 MG/2ML IJ SOLN
INTRAMUSCULAR | Status: AC
  Filled 2019-12-15: qty 2

## 2019-12-15 MED ORDER — FENTANYL CITRATE (PF) 100 MCG/2ML IJ SOLN
INTRAMUSCULAR | Status: AC
  Filled 2019-12-15: qty 2

## 2019-12-15 SURGICAL SUPPLY — 18 items
BAG DRAIN CYSTO-URO LG1000N (MISCELLANEOUS) ×3 IMPLANT
BRUSH SCRUB EZ  4% CHG (MISCELLANEOUS) ×2
BRUSH SCRUB EZ 4% CHG (MISCELLANEOUS) ×1 IMPLANT
DRSG TELFA 4X3 1S NADH ST (GAUZE/BANDAGES/DRESSINGS) ×3 IMPLANT
ELECT REM PT RETURN 9FT ADLT (ELECTROSURGICAL) ×3
ELECTRODE REM PT RTRN 9FT ADLT (ELECTROSURGICAL) ×1 IMPLANT
GLOVE BIO SURGEON STRL SZ 6.5 (GLOVE) ×2 IMPLANT
GLOVE BIO SURGEONS STRL SZ 6.5 (GLOVE) ×1
GOWN STRL REUS W/ TWL LRG LVL3 (GOWN DISPOSABLE) ×2 IMPLANT
GOWN STRL REUS W/TWL LRG LVL3 (GOWN DISPOSABLE) ×4
KIT TURNOVER CYSTO (KITS) ×3 IMPLANT
NDL SAFETY ECLIPSE 18X1.5 (NEEDLE) ×1 IMPLANT
NEEDLE HYPO 18GX1.5 SHARP (NEEDLE) ×2
PACK CYSTO AR (MISCELLANEOUS) ×3 IMPLANT
SET CYSTO W/LG BORE CLAMP LF (SET/KITS/TRAYS/PACK) ×3 IMPLANT
SURGILUBE 2OZ TUBE FLIPTOP (MISCELLANEOUS) ×3 IMPLANT
WATER STERILE IRR 1000ML POUR (IV SOLUTION) ×3 IMPLANT
WATER STERILE IRR 3000ML UROMA (IV SOLUTION) ×3 IMPLANT

## 2019-12-15 NOTE — Anesthesia Preprocedure Evaluation (Addendum)
Anesthesia Evaluation  Patient identified by MRN, date of birth, ID band Patient awake    Reviewed: Allergy & Precautions, H&P , NPO status , Patient's Chart, lab work & pertinent test results  History of Anesthesia Complications (+) PONV  Airway Mallampati: II  TM Distance: >3 FB Neck ROM: full    Dental  (+) Teeth Intact   Pulmonary neg pulmonary ROS, neg shortness of breath, neg COPD,    breath sounds clear to auscultation       Cardiovascular (-) angina(-) Past MI negative cardio ROS  (-) dysrhythmias  Rhythm:regular Rate:Normal     Neuro/Psych PSYCHIATRIC DISORDERS Anxiety negative neurological ROS     GI/Hepatic Neg liver ROS, GERD  Controlled,  Endo/Other  negative endocrine ROS  Renal/GU      Musculoskeletal   Abdominal   Peds  Hematology negative hematology ROS (+)   Anesthesia Other Findings Past Medical History: No date: Anxiety No date: Arthritis No date: Complication of anesthesia No date: GERD (gastroesophageal reflux disease) No date: PONV (postoperative nausea and vomiting)     Comment:  NAUSEA  Past Surgical History: 05/30/2018: KNEE ARTHROSCOPY WITH MEDIAL MENISECTOMY; Right     Comment:  Procedure: KNEE ARTHROSCOPY WITH MEDIAL MENISECTOMY               PARTIAL SYNOVECTOMY;  Surgeon: Hessie Knows, MD;                Location: [facility] ORS;  Service: Orthopedics;  Laterality:               Right; No date: NASAL SINUS SURGERY No date: THYROID CYST EXCISION No date: UTERINE FIBROID SURGERY     Reproductive/Obstetrics negative OB ROS                            Anesthesia Physical Anesthesia Plan  ASA: II  Anesthesia Plan: General LMA   Post-op Pain Management:    Induction:   PONV Risk Score and Plan: Dexamethasone, Ondansetron, Midazolam, Treatment may vary due to age or medical condition and Scopolamine patch - Pre-op  Airway Management Planned:    Additional Equipment:   Intra-op Plan:   Post-operative Plan:   Informed Consent: I have reviewed the patients History and Physical, chart, labs and discussed the procedure including the risks, benefits and alternatives for the proposed anesthesia with the patient or authorized representative who has indicated his/her understanding and acceptance.     Dental Advisory Given  Plan Discussed with: Anesthesiologist  Anesthesia Plan Comments:        Anesthesia Quick Evaluation

## 2019-12-15 NOTE — Op Note (Signed)
Date of procedure: 12/15/19  Preoperative diagnosis:  1. Pigmented bladder lesion  Postoperative diagnosis:  1. Same as above  Procedure: 1. Bladder biopsy  Surgeon: Hollice Espy, MD  Anesthesia: General  Complications: None  Intraoperative findings: 0.5 cm flat partially transparent hyperpigmented lesion on left posterior bladder wall.  EBL: Minimal  Specimens: Bladder biopsy, pigmented lesion  Drains: None  Indication: Kelsey Short is a 54 y.o. patient with microscopic hematuria found to have a sessile pigmented lesion on the posterior left bladder wall.  After reviewing the management options for treatment, he elected to proceed with the above surgical procedure(s). We have discussed the potential benefits and risks of the procedure, side effects of the proposed treatment, the likelihood of the patient achieving the goals of the procedure, and any potential problems that might occur during the procedure or recuperation. Informed consent has been obtained.  Description of procedure:  The patient was taken to the operating room and general anesthesia was induced.  The patient was placed in the dorsal lithotomy position, prepped and draped in the usual sterile fashion, and preoperative antibiotics were administered. A preoperative time-out was performed.   21 [place] scope was advanced per urethra into the bladder.  The single 0.5 cm lesion was noted on the left posterior bladder wall.  This lesion was somewhat ovoid in shape, sessile, and partially transparent with the underlying mucosa visible.  It was grayish blackish in color.  Pictures were taken.  Cold cup biopsy forceps were used to grasp of mucosa and resect the entirety of the lesion in 1 single bite.  Bugbee electrocautery was then used for hemostasis.  Bladder was then drained and the scope was removed.  Patient was then cleaned and dried, repositioned supine position, reversed from anesthesia and taken to the  PACU in stable condition.  Hollice Espy, M.D.

## 2019-12-15 NOTE — Transfer of Care (Signed)
Immediate Anesthesia Transfer of Care Note  Patient: Kelsey Short  Procedure(s) Performed: CYSTOSCOPY WITH BIOPSY (N/A )  Patient Location: PACU  Anesthesia Type:General  Level of Consciousness: awake, alert  and oriented  Airway & Oxygen Therapy: Patient Spontanous Breathing and Patient connected to face mask oxygen  Post-op Assessment: Report given to RN and Post -op Vital signs reviewed and stable  Post vital signs: Reviewed and stable  Last Vitals:  Vitals Value Taken Time  BP 130/83 12/15/19 1409  Temp 36.7 C 12/15/19 1409  Pulse 79 12/15/19 1414  Resp 15 12/15/19 1414  SpO2 100 % 12/15/19 1414  Vitals shown include unvalidated device data.  Last Pain:  Vitals:   12/15/19 1158  TempSrc: Tympanic  PainSc: 0-No pain         Complications: No apparent anesthesia complications

## 2019-12-15 NOTE — Discharge Instructions (Signed)
AMBULATORY SURGERY  DISCHARGE INSTRUCTIONS   1) The drugs that you were given will stay in your system until tomorrow so for the next 24 hours you should not:  A) Drive an automobile B) Make any legal decisions C) Drink any alcoholic beverage   2) You may resume regular meals tomorrow.  Today it is better to start with liquids and gradually work up to solid foods.  You may eat anything you prefer, but it is better to start with liquids, then soup and crackers, and gradually work up to solid foods.   3) Please notify your doctor immediately if you have any unusual bleeding, trouble breathing, redness and pain at the surgery site, drainage, fever, or pain not relieved by medication.    4) Additional Instructions:        Please contact your physician with any problems or Same Day Surgery at [phone], Monday through Friday 6 am to 4 pm, or [place] [facility] Main number at [phone].Transurethral Resection of Bladder Tumor (TURBT) or Bladder Biopsy   Definition:  Transurethral Resection of the Bladder Tumor is a surgical procedure used to diagnose and remove tumors within the bladder. TURBT is the most common treatment for early stage bladder cancer.  General instructions:     Your recent bladder surgery requires very little post hospital care but some definite precautions.  Despite the fact that no skin incisions were used, the area around the bladder incisions are raw and covered with scabs to promote healing and prevent bleeding. Certain precautions are needed to insure that the scabs are not disturbed over the next 2-4 weeks while the healing proceeds.  Because the raw surface inside your bladder and the irritating effects of urine you may expect frequency of urination and/or urgency (a stronger desire to urinate) and perhaps even getting up at night more often. This will usually resolve or improve slowly over the healing period. You may see some blood in your  urine over the first 6 weeks. Do not be alarmed, even if the urine was clear for a while. Get off your feet and drink lots of fluids until clearing occurs. If you start to pass clots or don't improve call us.  Diet:  You may return to your normal diet immediately. Because of the raw surface of your bladder, alcohol, spicy foods, foods high in acid and drinks with caffeine may cause irritation or frequency and should be used in moderation. To keep your urine flowing freely and avoid constipation, drink plenty of fluids during the day (8-10 glasses). Tip: Avoid cranberry juice because it is very acidic.  Activity:  Your physical activity doesn't need to be restricted. However, if you are very active, you may see some blood in the urine. We suggest that you reduce your activity under the circumstances until the bleeding has stopped.  Bowels:  It is important to keep your bowels regular during the postoperative period. Straining with bowel movements can cause bleeding. A bowel movement every other day is reasonable. Use a mild laxative if needed, such as milk of magnesia 2-3 tablespoons, or 2 Dulcolax tablets. Call if you continue to have problems. If you had been taking narcotics for pain, before, during or after your surgery, you may be constipated. Take a laxative if necessary.    Medication:  You should resume your pre-surgery medications unless told not to. In addition you may be given an antibiotic to prevent or treat infection. Antibiotics are not always necessary. All medication should   be taken as prescribed until the bottles are finished unless you are having an unusual reaction to one of the drugs.   [facility] [place],  [id number] [phone]    

## 2019-12-15 NOTE — Interval H&P Note (Signed)
History and Physical Interval Note:  12/15/2019 1:22 PM  Kelsey Short  has presented today for surgery, with the diagnosis of bladder lesion.  The various methods of treatment have been discussed with the patient and family. After consideration of risks, benefits and other options for treatment, the patient has consented to  Procedure(s): CYSTOSCOPY WITH BIOPSY (N/A) as a surgical intervention.  The patient's history has been reviewed, patient examined, no change in status, stable for surgery.  I have reviewed the patient's chart and labs.  Questions were answered to the patient's satisfaction.    RRR CTAB  Hollice Espy

## 2019-12-16 LAB — SURGICAL PATHOLOGY

## 2019-12-16 NOTE — Anesthesia Postprocedure Evaluation (Signed)
Anesthesia Post Note  Patient: Kelsey Short  Procedure(s) Performed: CYSTOSCOPY WITH BIOPSY (N/A )  Patient location during evaluation: PACU Anesthesia Type: General Level of consciousness: awake and alert Pain management: pain level controlled Vital Signs Assessment: post-procedure vital signs reviewed and stable Respiratory status: spontaneous breathing, nonlabored ventilation, respiratory function stable and patient connected to nasal cannula oxygen Cardiovascular status: blood pressure returned to baseline and stable Postop Assessment: no apparent nausea or vomiting Anesthetic complications: no     Last Vitals:  Vitals:   12/15/19 1507 12/15/19 1546  BP: (!) 144/72 139/71  Pulse: 72 72  Resp: 16 14  Temp: (!) 36.2 C   SpO2: 99% 100%    Last Pain:  Vitals:   12/16/19 0834  TempSrc:   PainSc: 0-No pain                 Martha Clan

## 2019-12-17 ENCOUNTER — Telehealth: Payer: Self-pay

## 2019-12-17 ENCOUNTER — Other Ambulatory Visit: Payer: 59

## 2019-12-17 MED ORDER — OXYBUTYNIN CHLORIDE 5 MG PO TABS: 5.0000 mg | ORAL_TABLET | Freq: Three times a day (TID) | ORAL | 0 refills | Status: DC

## 2019-12-17 NOTE — Telephone Encounter (Signed)
Patient called stating she is having some bladder spasms post op. Per Dr. Erlene Quan ok to send in oxybutinin to help, patient was notified TID PRN

## 2019-12-18 ENCOUNTER — Other Ambulatory Visit: Payer: 59

## 2020-01-12 DIAGNOSIS — G8929 Other chronic pain: Secondary | ICD-10-CM | POA: Diagnosis not present

## 2020-01-12 DIAGNOSIS — M25561 Pain in right knee: Secondary | ICD-10-CM | POA: Diagnosis not present

## 2020-01-28 DIAGNOSIS — G8929 Other chronic pain: Secondary | ICD-10-CM | POA: Diagnosis not present

## 2020-01-28 DIAGNOSIS — M25561 Pain in right knee: Secondary | ICD-10-CM | POA: Diagnosis not present

## 2020-02-04 DIAGNOSIS — M25561 Pain in right knee: Secondary | ICD-10-CM | POA: Diagnosis not present

## 2020-02-04 DIAGNOSIS — G8929 Other chronic pain: Secondary | ICD-10-CM | POA: Diagnosis not present

## 2020-02-11 DIAGNOSIS — L235 Allergic contact dermatitis due to other chemical products: Secondary | ICD-10-CM | POA: Diagnosis not present

## 2020-02-16 DIAGNOSIS — M1711 Unilateral primary osteoarthritis, right knee: Secondary | ICD-10-CM | POA: Diagnosis not present

## 2020-02-16 DIAGNOSIS — M25461 Effusion, right knee: Secondary | ICD-10-CM | POA: Diagnosis not present

## 2020-04-01 ENCOUNTER — Ambulatory Visit: Payer: 59 | Admitting: Urology

## 2020-04-07 DIAGNOSIS — J0100 Acute maxillary sinusitis, unspecified: Secondary | ICD-10-CM | POA: Diagnosis not present

## 2020-04-28 DIAGNOSIS — Z20822 Contact with and (suspected) exposure to covid-19: Secondary | ICD-10-CM | POA: Diagnosis not present

## 2020-04-28 DIAGNOSIS — R945 Abnormal results of liver function studies: Secondary | ICD-10-CM | POA: Diagnosis not present

## 2020-04-28 DIAGNOSIS — N959 Unspecified menopausal and perimenopausal disorder: Secondary | ICD-10-CM | POA: Diagnosis not present

## 2020-04-28 DIAGNOSIS — R509 Fever, unspecified: Secondary | ICD-10-CM | POA: Diagnosis not present

## 2020-04-28 DIAGNOSIS — J019 Acute sinusitis, unspecified: Secondary | ICD-10-CM | POA: Diagnosis not present

## 2020-05-06 DIAGNOSIS — R7989 Other specified abnormal findings of blood chemistry: Secondary | ICD-10-CM | POA: Diagnosis not present

## 2020-05-06 DIAGNOSIS — B159 Hepatitis A without hepatic coma: Secondary | ICD-10-CM | POA: Diagnosis not present

## 2020-05-19 DIAGNOSIS — R7989 Other specified abnormal findings of blood chemistry: Secondary | ICD-10-CM | POA: Diagnosis not present

## 2020-05-20 DIAGNOSIS — B159 Hepatitis A without hepatic coma: Secondary | ICD-10-CM | POA: Diagnosis not present

## 2020-05-20 DIAGNOSIS — R7989 Other specified abnormal findings of blood chemistry: Secondary | ICD-10-CM | POA: Diagnosis not present

## 2020-05-20 DIAGNOSIS — R509 Fever, unspecified: Secondary | ICD-10-CM | POA: Diagnosis not present

## 2020-05-31 ENCOUNTER — Other Ambulatory Visit: Payer: Self-pay | Admitting: Infectious Diseases

## 2020-05-31 ENCOUNTER — Other Ambulatory Visit (HOSPITAL_COMMUNITY): Payer: Self-pay | Admitting: Infectious Diseases

## 2020-05-31 DIAGNOSIS — B159 Hepatitis A without hepatic coma: Secondary | ICD-10-CM

## 2020-05-31 DIAGNOSIS — R7989 Other specified abnormal findings of blood chemistry: Secondary | ICD-10-CM

## 2020-06-04 ENCOUNTER — Ambulatory Visit: Payer: Self-pay

## 2020-06-14 ENCOUNTER — Other Ambulatory Visit: Payer: Self-pay

## 2020-06-14 ENCOUNTER — Ambulatory Visit
Admission: RE | Admit: 2020-06-14 | Discharge: 2020-06-14 | Disposition: A | Discharge status: Home / Self Care | Payer: Self-pay | Source: Ambulatory Visit | Attending: Infectious Diseases | Admitting: Infectious Diseases

## 2020-06-14 DIAGNOSIS — B159 Hepatitis A without hepatic coma: Secondary | ICD-10-CM | POA: Insufficient documentation

## 2020-06-14 DIAGNOSIS — R7989 Other specified abnormal findings of blood chemistry: Secondary | ICD-10-CM | POA: Diagnosis not present

## 2020-06-14 DIAGNOSIS — K8020 Calculus of gallbladder without cholecystitis without obstruction: Secondary | ICD-10-CM | POA: Diagnosis not present

## 2020-07-20 DIAGNOSIS — K8020 Calculus of gallbladder without cholecystitis without obstruction: Secondary | ICD-10-CM | POA: Diagnosis not present

## 2020-08-16 DIAGNOSIS — Z13220 Encounter for screening for lipoid disorders: Secondary | ICD-10-CM | POA: Diagnosis not present

## 2020-08-16 DIAGNOSIS — Z1389 Encounter for screening for other disorder: Secondary | ICD-10-CM | POA: Diagnosis not present

## 2020-08-16 DIAGNOSIS — E538 Deficiency of other specified B group vitamins: Secondary | ICD-10-CM | POA: Diagnosis not present

## 2020-08-16 DIAGNOSIS — Z0000 Encounter for general adult medical examination without abnormal findings: Secondary | ICD-10-CM | POA: Diagnosis not present

## 2020-08-20 DIAGNOSIS — Z0000 Encounter for general adult medical examination without abnormal findings: Secondary | ICD-10-CM | POA: Diagnosis not present

## 2020-08-20 DIAGNOSIS — R7989 Other specified abnormal findings of blood chemistry: Secondary | ICD-10-CM | POA: Diagnosis not present

## 2020-08-20 DIAGNOSIS — E538 Deficiency of other specified B group vitamins: Secondary | ICD-10-CM | POA: Diagnosis not present

## 2020-10-11 DIAGNOSIS — H5203 Hypermetropia, bilateral: Secondary | ICD-10-CM | POA: Diagnosis not present

## 2021-02-08 ENCOUNTER — Emergency Department: Payer: BLUE CROSS/BLUE SHIELD

## 2021-02-08 ENCOUNTER — Other Ambulatory Visit: Payer: Self-pay

## 2021-02-08 ENCOUNTER — Emergency Department
Admission: EM | Admit: 2021-02-08 | Discharge: 2021-02-08 | Disposition: A | Discharge status: Home / Self Care | Payer: BLUE CROSS/BLUE SHIELD | Attending: Emergency Medicine | Admitting: Emergency Medicine

## 2021-02-08 DIAGNOSIS — R112 Nausea with vomiting, unspecified: Secondary | ICD-10-CM | POA: Diagnosis not present

## 2021-02-08 DIAGNOSIS — R197 Diarrhea, unspecified: Secondary | ICD-10-CM | POA: Diagnosis not present

## 2021-02-08 DIAGNOSIS — R1011 Right upper quadrant pain: Secondary | ICD-10-CM | POA: Insufficient documentation

## 2021-02-08 LAB — CBC WITH DIFFERENTIAL/PLATELET
Abs Immature Granulocytes: 0.01 10*3/uL (ref 0.00–0.07)
Basophils Absolute: 0 10*3/uL (ref 0.0–0.1)
Basophils Relative: 1 %
Eosinophils Absolute: 0 10*3/uL (ref 0.0–0.5)
Eosinophils Relative: 1 %
HCT: 40.1 % (ref 36.0–46.0)
Hemoglobin: 13.7 g/dL (ref 12.0–15.0)
Immature Granulocytes: 0 %
Lymphocytes Relative: 23 %
Lymphs Abs: 1.7 10*3/uL (ref 0.7–4.0)
MCH: 31.1 pg (ref 26.0–34.0)
MCHC: 34.2 g/dL (ref 30.0–36.0)
MCV: 91.1 fL (ref 80.0–100.0)
Monocytes Absolute: 0.6 10*3/uL (ref 0.1–1.0)
Monocytes Relative: 8 %
Neutro Abs: 5.1 10*3/uL (ref 1.7–7.7)
Neutrophils Relative %: 67 %
Platelets: 237 10*3/uL (ref 150–400)
RBC: 4.4 MIL/uL (ref 3.87–5.11)
RDW: 12.3 % (ref 11.5–15.5)
WBC: 7.4 10*3/uL (ref 4.0–10.5)
nRBC: 0 % (ref 0.0–0.2)

## 2021-02-08 LAB — COMPREHENSIVE METABOLIC PANEL
ALT: 24 U/L (ref 0–44)
AST: 26 U/L (ref 15–41)
Albumin: 3.8 g/dL (ref 3.5–5.0)
Alkaline Phosphatase: 65 U/L (ref 38–126)
Anion gap: 9 (ref 5–15)
BUN: 12 mg/dL (ref 6–20)
CO2: 27 mmol/L (ref 22–32)
Calcium: 8.7 mg/dL — ABNORMAL LOW (ref 8.9–10.3)
Chloride: 103 mmol/L (ref 98–111)
Creatinine, Ser: 0.81 mg/dL (ref 0.44–1.00)
GFR, Estimated: >60 mL/min (ref >60)
Glucose, Bld: 124 mg/dL — ABNORMAL HIGH (ref 70–99)
Potassium: 3.3 mmol/L — ABNORMAL LOW (ref 3.5–5.1)
Sodium: 139 mmol/L (ref 135–145)
Total Bilirubin: 0.6 mg/dL (ref 0.3–1.2)
Total Protein: 7.1 g/dL (ref 6.5–8.1)

## 2021-02-08 LAB — LIPASE, BLOOD: Lipase: 37 U/L (ref 11–51)

## 2021-02-08 MED ORDER — HYDROCODONE-ACETAMINOPHEN 5-325 MG PO TABS: 1.0000 | ORAL_TABLET | ORAL | 0 refills | Status: AC | PRN

## 2021-02-08 MED ORDER — MORPHINE SULFATE (PF) 4 MG/ML IV SOLN
4.0000 mg | Freq: Once | INTRAVENOUS | Status: AC
Start: 2021-02-08 — End: 2021-02-08
  Administered 2021-02-08: 4 mg via INTRAVENOUS
  Filled 2021-02-08: qty 1

## 2021-02-08 MED ORDER — ONDANSETRON 8 MG PO TBDP: 8.0000 mg | ORAL_TABLET | Freq: Three times a day (TID) | ORAL | 0 refills | Status: DC | PRN

## 2021-02-08 MED ORDER — ONDANSETRON HCL 4 MG/2ML IJ SOLN
4.0000 mg | Freq: Once | INTRAMUSCULAR | Status: AC
  Administered 2021-02-08: 4 mg via INTRAVENOUS
  Filled 2021-02-08: qty 2

## 2021-02-08 MED ORDER — MORPHINE SULFATE (PF) 4 MG/ML IV SOLN
4.0000 mg | Freq: Once | INTRAVENOUS | Status: AC
  Administered 2021-02-08: 4 mg via INTRAVENOUS
  Filled 2021-02-08: qty 1

## 2021-02-08 NOTE — ED Notes (Signed)
US at bedside

## 2021-02-08 NOTE — ED Triage Notes (Signed)
Pt in with co epigastric pain states has had n.v.d. since Friday. Has continued to have vomiting and increased pain. Pt states she does have hx of gallstones.

## 2021-02-08 NOTE — ED Provider Notes (Addendum)
[facility] Emergency Department Provider Note ____________________________________________   Event Date/Time   First MD Initiated Contact with Patient 02/08/21 [phone]     (approximate)  I have reviewed the triage vital signs and the nursing notes.   HISTORY  Chief Complaint Abdominal Pain    HPI Kelsey Short is a 55 y.o. female with PMH as noted below including history of cholelithiasis who presents with right upper quadrant abdominal pain, acute onset this morning, initially more in the epigastric region and now radiating over to the right side.  She reports associated nausea and vomiting but no diarrhea.  The patient states that 4 days ago she had vomiting and diarrhea that she thought was a gastroenteritis.  There was no significant abdominal pain at that time.  She states those symptoms subsided over the last 2 days until this morning.  She rates the pain at a 7 out of 10.    Past Medical History:  Diagnosis Date  . Anxiety   . Arthritis   . Complication of anesthesia   . GERD (gastroesophageal reflux disease)   . PONV (postoperative nausea and vomiting)    NAUSEA    Patient Active Problem List   Diagnosis Date Noted  . Osteoarthritis of knee 03/04/2019  . Pain in right knee 01/10/2019  . Family hx osteoporosis 09/11/2018  . Osteoarthritis of right knee 08/06/2018  . DDD (degenerative disc disease), lumbar 08/01/2017  . Hematuria, microscopic 07/02/2016  . 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 Low serum vitamin D 03/31/2016  . B12 deficiency 03/16/2015  . Other symptoms involving urinary system(788.99) 06/11/2013  . Incomplete emptying of bladder 06/11/2013  . Chronic cystitis 06/11/2013    Past Surgical History:  Procedure Laterality Date  . CYSTOSCOPY WITH BIOPSY N/A 12/15/2019   Procedure: CYSTOSCOPY WITH BIOPSY;  Surgeon: Hollice Espy, MD;  Location: [facility] ORS;  Service: Urology;  Laterality: N/A;  . KNEE ARTHROSCOPY WITH MEDIAL  MENISECTOMY Right 05/30/2018   Procedure: KNEE ARTHROSCOPY WITH MEDIAL MENISECTOMY PARTIAL SYNOVECTOMY;  Surgeon: Hessie Knows, MD;  Location: [facility] ORS;  Service: Orthopedics;  Laterality: Right;  . NASAL SINUS SURGERY    . THYROID CYST EXCISION    . UTERINE FIBROID SURGERY      Prior to Admission medications   Medication Sig Start Date End Date Taking? Authorizing Provider  HYDROcodone-acetaminophen (NORCO/VICODIN) 5-325 MG tablet Take 1 tablet by mouth every 4 (four) hours as needed for up to 3 days for moderate pain. 02/08/21 02/11/21 Yes Arta Silence, MD  ondansetron (ZOFRAN ODT) 8 MG disintegrating tablet Take 1 tablet (8 mg total) by mouth every 8 (eight) hours as needed for nausea or vomiting. 02/08/21  Yes Arta Silence, MD  acetaminophen (TYLENOL) 650 MG CR tablet Take 650 mg by mouth 2 (two) times daily as needed for pain.    [provider]  escitalopram (LEXAPRO) 20 MG tablet Take 20 mg by mouth every evening.    [provider]  Melatonin 10 MG TABS Take 10 mg by mouth daily as needed (sleep).    [provider]  oxybutynin (DITROPAN) 5 MG tablet Take 1 tablet (5 mg total) by mouth 3 (three) times daily. 12/17/19   Hollice Espy, MD    Allergies Oxycodone  Family History  Problem Relation Age of Onset  . Bladder Cancer Father   . Prostate cancer Paternal Uncle   . Prostate cancer Paternal Uncle   . Prostate cancer Paternal Grandfather     Social History Social  History   Tobacco Use  . Smoking status: Never Smoker  . Smokeless tobacco: Never Used  Vaping Use  . Vaping Use: Never used  Substance Use Topics  . Alcohol use: Never  . Drug use: Never    Review of Systems  Constitutional: No fever. Eyes: No redness. ENT: No sore throat. Cardiovascular: Denies chest pain. Respiratory: Denies shortness of breath. Gastrointestinal: Positive for nausea and vomiting. Genitourinary: Negative for flank pain. Musculoskeletal:  Negative for back pain. Skin: Negative for rash. Neurological: Negative for headache.   ____________________________________________   PHYSICAL EXAM:  VITAL SIGNS: ED Triage Vitals  Enc Vitals Group     BP 02/08/21 0646 (!) 154/81     Pulse Rate 02/08/21 0646 73     Resp 02/08/21 0646 20     Temp 02/08/21 0646 98.5 F (36.9 C)     Temp Source 02/08/21 0646 Oral     SpO2 02/08/21 0646 100 %     Weight 02/08/21 0647 193 lb (87.5 kg)     Height 02/08/21 0647 6\' 2"  (1.88 m)     Head Circumference --      Peak Flow --      Pain Score 02/08/21 0647 8     Pain Loc --      Pain Edu? --      Excl. in [place]? --     Constitutional: Alert and oriented. Well appearing and in no acute distress. Eyes: Conjunctivae are normal.  No scleral icterus. Head: Atraumatic. Nose: No congestion/rhinnorhea. Mouth/Throat: Mucous membranes are moist.   Neck: Normal range of motion.  Cardiovascular: Normal rate, regular rhythm.  Good peripheral circulation. Respiratory: Normal respiratory effort.  No retractions.  Gastrointestinal: Soft with moderate right upper quadrant tenderness.  No distention.  Genitourinary: No flank tenderness. Musculoskeletal: Extremities warm and well perfused.  Neurologic:  Normal speech and language. No gross focal neurologic deficits are appreciated.  Skin:  Skin is warm and dry. No rash noted. Psychiatric: Mood and affect are normal. Speech and behavior are normal.  ____________________________________________   LABS (all labs ordered are listed, but only abnormal results are displayed)  Labs Reviewed  COMPREHENSIVE METABOLIC PANEL - Abnormal; Notable for the following components:      Result Value   Potassium 3.3 (*)    Glucose, Bld 124 (*)    Calcium 8.7 (*)    All other components within normal limits  CBC WITH DIFFERENTIAL/PLATELET  LIPASE, BLOOD  URINALYSIS, COMPLETE (UACMP) WITH MICROSCOPIC   ____________________________________________  EKG  ED ECG  REPORT I, Arta Silence, the attending physician, personally viewed and interpreted this ECG.  Date: 02/08/2021 EKG Time: 0654 Rate: 67 Rhythm: normal sinus rhythm QRS Axis: normal Intervals: normal ST/T Wave abnormalities: normal Narrative Interpretation: no evidence of acute ischemia  ____________________________________________  RADIOLOGY  US abdomen RUQ: Cholelithiasis without evidence of acute cholecystitis  ____________________________________________   PROCEDURES  Procedure(s) performed: No  Procedures  Critical Care performed: No ____________________________________________   INITIAL IMPRESSION / ASSESSMENT AND PLAN / ED COURSE  Pertinent labs & imaging results that were available during my care of the patient were reviewed by me and considered in my medical decision making (see chart for details).  55 year old female with PMH as noted above including history of cholelithiasis presents with acute onset of epigastric and right upper quadrant pain this morning associated with nausea and vomiting.  She had some vomiting and diarrhea several days ago which had subsequently resolved.  On exam the patient is overall  well-appearing.  Her vital signs are normal except for mild hypertension.  She is tender in the right upper quadrant with no peritoneal signs.  Exam is otherwise unremarkable.  I reviewed the past medical records in epic.  The patient had a right upper quadrant ultrasound on 06/14/2020 which shows cholelithiasis but no other acute findings.  Differential includes biliary colic, acute cholecystitis, other hepatobiliary etiology, gastritis, GERD.  I have a low suspicion for appendicitis or colitis given the location of the pain.  We will obtain labs and a right upper quadrant ultrasound.  ----------------------------------------- 11:33 AM on 02/08/2021 -----------------------------------------  Lab work-up was within normal limits with no leukocytosis  or elevated LFTs.  Ultrasound shows gallstones with no evidence of acute cholecystitis.  Overall presentation is most consistent with biliary colic although gastritis is still on the differential.  The patient has been taking Pepcid over the last several days.  On reassessment, the patient is comfortable.  Her pain is almost completely resolved.  She feels well to go home.  I counseled her on the results of the work-up.  She is stable for discharge at this time.  I recommended that she follow-up with a general surgeon.  She has seen Dr. Bary Castilla in the past so she will follow up with him.  Return precautions given, and she expresses understanding.  ____________________________________________   FINAL CLINICAL IMPRESSION(S) / ED DIAGNOSES  Final diagnoses:  Right upper quadrant pain      NEW MEDICATIONS STARTED DURING THIS VISIT:  New Prescriptions   HYDROCODONE-ACETAMINOPHEN (NORCO/VICODIN) 5-325 MG TABLET    Take 1 tablet by mouth every 4 (four) hours as needed for up to 3 days for moderate pain.   ONDANSETRON (ZOFRAN ODT) 8 MG DISINTEGRATING TABLET    Take 1 tablet (8 mg total) by mouth every 8 (eight) hours as needed for nausea or vomiting.     Note:  This document was prepared using Dragon voice recognition software and may include unintentional dictation errors.   Arta Silence, MD 02/08/21 1135    Arta Silence, MD 02/08/21 1136

## 2021-02-08 NOTE — Discharge Instructions (Addendum)
We suspect that you are having pain due to biliary colic, or "gallbladder attack" from gallstones.  You could also be having gastritis which is inflammation in the stomach lining after your recent vomiting and diarrhea  Continue taking the Pepcid.  You may take the hydrocodone if needed for more severe pain and the Zofran if needed for nausea.  Follow-up with Dr. Bary Castilla.  Return to the ER for new, worsening, or persistent severe abdominal pain, vomiting, fever, weakness, or any other new or worsening symptoms that concern you.

## 2021-02-08 NOTE — ED Notes (Signed)
Pt alert and oriented X 4, stable for discharge. RR even and unlabored, color WNL. Discussed discharge instructions and follow-up as directed. Discharge medications discussed if prescribed. Pt had opportunity to ask questions, and RN to provide patient/family eduction.  

## 2021-04-14 ENCOUNTER — Other Ambulatory Visit: Payer: Self-pay

## 2021-04-14 ENCOUNTER — Encounter
Admission: EM | Disposition: A | Discharge status: Home / Self Care | Payer: Self-pay | Source: Home / Self Care | Attending: Emergency Medicine

## 2021-04-14 ENCOUNTER — Emergency Department: Payer: Managed Care, Other (non HMO) | Admitting: Anesthesiology

## 2021-04-14 ENCOUNTER — Emergency Department: Payer: Managed Care, Other (non HMO)

## 2021-04-14 ENCOUNTER — Encounter: Payer: Self-pay | Admitting: Emergency Medicine

## 2021-04-14 ENCOUNTER — Ambulatory Visit
Admission: EM | Admit: 2021-04-14 | Discharge: 2021-04-14 | Disposition: A | Discharge status: Home / Self Care | Payer: Managed Care, Other (non HMO) | Attending: General Surgery | Admitting: General Surgery

## 2021-04-14 ENCOUNTER — Ambulatory Visit: Admit: 2021-04-14 | Payer: BLUE CROSS/BLUE SHIELD | Admitting: General Surgery

## 2021-04-14 DIAGNOSIS — K8012 Calculus of gallbladder with acute and chronic cholecystitis without obstruction: Secondary | ICD-10-CM | POA: Diagnosis not present

## 2021-04-14 DIAGNOSIS — K8020 Calculus of gallbladder without cholecystitis without obstruction: Secondary | ICD-10-CM

## 2021-04-14 DIAGNOSIS — Z885 Allergy status to narcotic agent status: Secondary | ICD-10-CM | POA: Insufficient documentation

## 2021-04-14 DIAGNOSIS — Z20822 Contact with and (suspected) exposure to covid-19: Secondary | ICD-10-CM | POA: Insufficient documentation

## 2021-04-14 DIAGNOSIS — R1011 Right upper quadrant pain: Secondary | ICD-10-CM | POA: Diagnosis present

## 2021-04-14 DIAGNOSIS — Z79899 Other long term (current) drug therapy: Secondary | ICD-10-CM | POA: Diagnosis not present

## 2021-04-14 DIAGNOSIS — K810 Acute cholecystitis: Secondary | ICD-10-CM

## 2021-04-14 LAB — URINALYSIS, COMPLETE (UACMP) WITH MICROSCOPIC
Bacteria, UA: NONE SEEN
Bilirubin Urine: NEGATIVE
Glucose, UA: NEGATIVE mg/dL
Ketones, ur: NEGATIVE mg/dL
Leukocytes,Ua: NEGATIVE
Nitrite: NEGATIVE
Protein, ur: NEGATIVE mg/dL
Specific Gravity, Urine: 1.015 (ref 1.005–1.030)
pH: 5 (ref 5.0–8.0)

## 2021-04-14 LAB — CBC WITH DIFFERENTIAL/PLATELET
Abs Immature Granulocytes: 0.02 10*3/uL (ref 0.00–0.07)
Basophils Absolute: 0.1 10*3/uL (ref 0.0–0.1)
Basophils Relative: 1 %
Eosinophils Absolute: 0.2 10*3/uL (ref 0.0–0.5)
Eosinophils Relative: 2 %
HCT: 41.2 % (ref 36.0–46.0)
Hemoglobin: 14.2 g/dL (ref 12.0–15.0)
Immature Granulocytes: 0 %
Lymphocytes Relative: 47 %
Lymphs Abs: 4.1 10*3/uL — ABNORMAL HIGH (ref 0.7–4.0)
MCH: 31.5 pg (ref 26.0–34.0)
MCHC: 34.5 g/dL (ref 30.0–36.0)
MCV: 91.4 fL (ref 80.0–100.0)
Monocytes Absolute: 0.7 10*3/uL (ref 0.1–1.0)
Monocytes Relative: 7 %
Neutro Abs: 3.8 10*3/uL (ref 1.7–7.7)
Neutrophils Relative %: 43 %
Platelets: 270 10*3/uL (ref 150–400)
RBC: 4.51 MIL/uL (ref 3.87–5.11)
RDW: 12.3 % (ref 11.5–15.5)
WBC: 8.8 10*3/uL (ref 4.0–10.5)
nRBC: 0 % (ref 0.0–0.2)

## 2021-04-14 LAB — COMPREHENSIVE METABOLIC PANEL
ALT: 15 U/L (ref 0–44)
AST: 19 U/L (ref 15–41)
Albumin: 4 g/dL (ref 3.5–5.0)
Alkaline Phosphatase: 70 U/L (ref 38–126)
Anion gap: 7 (ref 5–15)
BUN: 14 mg/dL (ref 6–20)
CO2: 28 mmol/L (ref 22–32)
Calcium: 9.3 mg/dL (ref 8.9–10.3)
Chloride: 104 mmol/L (ref 98–111)
Creatinine, Ser: 0.87 mg/dL (ref 0.44–1.00)
GFR, Estimated: >60 mL/min (ref >60)
Glucose, Bld: 129 mg/dL — ABNORMAL HIGH (ref 70–99)
Potassium: 3.6 mmol/L (ref 3.5–5.1)
Sodium: 139 mmol/L (ref 135–145)
Total Bilirubin: 0.6 mg/dL (ref 0.3–1.2)
Total Protein: 7.2 g/dL (ref 6.5–8.1)

## 2021-04-14 LAB — RESP PANEL BY RT-PCR (FLU A&B, COVID) ARPGX2
Influenza A by PCR: NEGATIVE
Influenza B by PCR: NEGATIVE
SARS Coronavirus 2 by RT PCR: NEGATIVE

## 2021-04-14 LAB — POC URINE PREG, ED: Preg Test, Ur: NEGATIVE

## 2021-04-14 LAB — LIPASE, BLOOD: Lipase: 32 U/L (ref 11–51)

## 2021-04-14 SURGERY — CHOLECYSTECTOMY, ROBOT-ASSISTED, LAPAROSCOPIC
Anesthesia: General

## 2021-04-14 MED ORDER — ACETAMINOPHEN 10 MG/ML IV SOLN
INTRAVENOUS | Status: DC | PRN
  Administered 2021-04-14: 1000 mg via INTRAVENOUS

## 2021-04-14 MED ORDER — CEFAZOLIN SODIUM-DEXTROSE 2-4 GM/100ML-% IV SOLN
INTRAVENOUS | Status: AC
  Filled 2021-04-14: qty 100

## 2021-04-14 MED ORDER — LIDOCAINE HCL (PF) 2 % IJ SOLN
INTRAMUSCULAR | Status: AC
  Filled 2021-04-14: qty 5

## 2021-04-14 MED ORDER — ESCITALOPRAM OXALATE 10 MG PO TABS: 20.0000 mg | ORAL_TABLET | Freq: Every evening | ORAL | Status: DC

## 2021-04-14 MED ORDER — APREPITANT 40 MG PO CAPS: 40.0000 mg | ORAL_CAPSULE | Freq: Once | ORAL | Status: AC

## 2021-04-14 MED ORDER — ONDANSETRON HCL 4 MG/2ML IJ SOLN: 4.0000 mg | Freq: Once | INTRAMUSCULAR | Status: DC | PRN

## 2021-04-14 MED ORDER — INDOCYANINE GREEN 25 MG IV SOLR
1.2500 mg | Freq: Once | INTRAVENOUS | Status: AC
  Administered 2021-04-14: 1.25 mg via INTRAVENOUS
  Filled 2021-04-14: qty 0.5

## 2021-04-14 MED ORDER — BUPIVACAINE-EPINEPHRINE (PF) 0.25% -1:200000 IJ SOLN
INTRAMUSCULAR | Status: AC
  Filled 2021-04-14: qty 30

## 2021-04-14 MED ORDER — PROPOFOL 10 MG/ML IV BOLUS
INTRAVENOUS | Status: AC
  Filled 2021-04-14: qty 20

## 2021-04-14 MED ORDER — FENTANYL CITRATE (PF) 100 MCG/2ML IJ SOLN
INTRAMUSCULAR | Status: AC
  Administered 2021-04-14: 25 ug via INTRAVENOUS
  Filled 2021-04-14: qty 2

## 2021-04-14 MED ORDER — MIDAZOLAM HCL 2 MG/2ML IJ SOLN
INTRAMUSCULAR | Status: DC | PRN
  Administered 2021-04-14: 2 mg via INTRAVENOUS

## 2021-04-14 MED ORDER — ONDANSETRON HCL 4 MG/2ML IJ SOLN
4.0000 mg | Freq: Once | INTRAMUSCULAR | Status: AC
  Administered 2021-04-14: 4 mg via INTRAVENOUS
  Filled 2021-04-14: qty 2

## 2021-04-14 MED ORDER — SODIUM CHLORIDE 0.9 % IV SOLN: INTRAVENOUS | Status: DC

## 2021-04-14 MED ORDER — LACTATED RINGERS IV SOLN: INTRAVENOUS | Status: DC

## 2021-04-14 MED ORDER — PHENYLEPHRINE HCL (PRESSORS) 10 MG/ML IV SOLN
INTRAVENOUS | Status: DC | PRN
  Administered 2021-04-14 (×3): 100 ug via INTRAVENOUS

## 2021-04-14 MED ORDER — ONDANSETRON HCL 4 MG/2ML IJ SOLN
INTRAMUSCULAR | Status: AC
  Filled 2021-04-14: qty 2

## 2021-04-14 MED ORDER — BUPIVACAINE-EPINEPHRINE 0.25% -1:200000 IJ SOLN
INTRAMUSCULAR | Status: DC | PRN
  Administered 2021-04-14: 30 mL

## 2021-04-14 MED ORDER — FENTANYL CITRATE (PF) 100 MCG/2ML IJ SOLN
INTRAMUSCULAR | Status: AC
  Filled 2021-04-14: qty 2

## 2021-04-14 MED ORDER — PROPOFOL 10 MG/ML IV BOLUS
INTRAVENOUS | Status: DC | PRN
  Administered 2021-04-14: 160 mg via INTRAVENOUS

## 2021-04-14 MED ORDER — HYDROMORPHONE HCL 1 MG/ML IJ SOLN
0.5000 mg | Freq: Once | INTRAMUSCULAR | Status: AC
  Administered 2021-04-14: 0.5 mg via INTRAVENOUS
  Filled 2021-04-14: qty 1

## 2021-04-14 MED ORDER — EPHEDRINE 5 MG/ML INJ
INTRAVENOUS | Status: AC
  Filled 2021-04-14: qty 10

## 2021-04-14 MED ORDER — SODIUM CHLORIDE 0.9 % IV SOLN
INTRAVENOUS | Status: DC
Start: 2021-04-14 — End: 2021-04-15

## 2021-04-14 MED ORDER — SCOPOLAMINE 1 MG/3DAYS TD PT72
1.0000 | MEDICATED_PATCH | TRANSDERMAL | Status: DC
  Administered 2021-04-14: 1.5 mg via TRANSDERMAL

## 2021-04-14 MED ORDER — SCOPOLAMINE 1 MG/3DAYS TD PT72
MEDICATED_PATCH | TRANSDERMAL | Status: AC
  Filled 2021-04-14: qty 1

## 2021-04-14 MED ORDER — HYDROCODONE-ACETAMINOPHEN 5-325 MG PO TABS: 1.0000 | ORAL_TABLET | Freq: Once | ORAL | Status: AC

## 2021-04-14 MED ORDER — ROCURONIUM BROMIDE 100 MG/10ML IV SOLN
INTRAVENOUS | Status: DC | PRN
  Administered 2021-04-14: 20 mg via INTRAVENOUS
  Administered 2021-04-14: 50 mg via INTRAVENOUS

## 2021-04-14 MED ORDER — CHLORHEXIDINE GLUCONATE 0.12 % MT SOLN
OROMUCOSAL | Status: AC
  Administered 2021-04-14: 15 mL via OROMUCOSAL
  Filled 2021-04-14: qty 15

## 2021-04-14 MED ORDER — MIDAZOLAM HCL 2 MG/2ML IJ SOLN
INTRAMUSCULAR | Status: AC
  Filled 2021-04-14: qty 2

## 2021-04-14 MED ORDER — ROCURONIUM BROMIDE 10 MG/ML (PF) SYRINGE
PREFILLED_SYRINGE | INTRAVENOUS | Status: AC
  Filled 2021-04-14: qty 10

## 2021-04-14 MED ORDER — HYDROMORPHONE HCL 1 MG/ML IJ SOLN
1.0000 mg | Freq: Once | INTRAMUSCULAR | Status: AC
  Administered 2021-04-14: 1 mg via INTRAVENOUS
  Filled 2021-04-14: qty 1

## 2021-04-14 MED ORDER — CEFAZOLIN SODIUM-DEXTROSE 2-4 GM/100ML-% IV SOLN
2.0000 g | INTRAVENOUS | Status: AC
  Administered 2021-04-14: 2 g via INTRAVENOUS

## 2021-04-14 MED ORDER — OXYBUTYNIN CHLORIDE 5 MG PO TABS: 5.0000 mg | ORAL_TABLET | Freq: Three times a day (TID) | ORAL | 0 refills | Status: DC

## 2021-04-14 MED ORDER — APREPITANT 40 MG PO CAPS
ORAL_CAPSULE | ORAL | Status: AC
  Administered 2021-04-14: 40 mg via ORAL
  Filled 2021-04-14: qty 1

## 2021-04-14 MED ORDER — METRONIDAZOLE 500 MG/100ML IV SOLN
500.0000 mg | Freq: Once | INTRAVENOUS | Status: AC
  Administered 2021-04-14: 500 mg via INTRAVENOUS
  Filled 2021-04-14: qty 100

## 2021-04-14 MED ORDER — HYDROCODONE-ACETAMINOPHEN 5-325 MG PO TABS
ORAL_TABLET | ORAL | Status: AC
  Administered 2021-04-14: 1 via ORAL
  Filled 2021-04-14: qty 1

## 2021-04-14 MED ORDER — ACETAMINOPHEN 10 MG/ML IV SOLN
INTRAVENOUS | Status: AC
  Filled 2021-04-14: qty 100

## 2021-04-14 MED ORDER — FENTANYL CITRATE (PF) 100 MCG/2ML IJ SOLN
INTRAMUSCULAR | Status: DC | PRN
  Administered 2021-04-14 (×2): 50 ug via INTRAVENOUS

## 2021-04-14 MED ORDER — ONDANSETRON HCL 4 MG/2ML IJ SOLN
INTRAMUSCULAR | Status: DC | PRN
  Administered 2021-04-14: 4 mg via INTRAVENOUS

## 2021-04-14 MED ORDER — DEXAMETHASONE SODIUM PHOSPHATE 10 MG/ML IJ SOLN
INTRAMUSCULAR | Status: AC
  Filled 2021-04-14: qty 1

## 2021-04-14 MED ORDER — SUGAMMADEX SODIUM 200 MG/2ML IV SOLN
INTRAVENOUS | Status: DC | PRN
  Administered 2021-04-14: 200 mg via INTRAVENOUS

## 2021-04-14 MED ORDER — EPHEDRINE SULFATE 50 MG/ML IJ SOLN
INTRAMUSCULAR | Status: DC | PRN
  Administered 2021-04-14: 10 mg via INTRAVENOUS

## 2021-04-14 MED ORDER — OXYBUTYNIN CHLORIDE 5 MG PO TABS
5.0000 mg | ORAL_TABLET | Freq: Three times a day (TID) | ORAL | Status: DC
  Filled 2021-04-14 (×3): qty 1

## 2021-04-14 MED ORDER — FENTANYL CITRATE (PF) 100 MCG/2ML IJ SOLN
25.0000 ug | INTRAMUSCULAR | Status: DC | PRN
  Administered 2021-04-14 (×2): 25 ug via INTRAVENOUS

## 2021-04-14 MED ORDER — SODIUM CHLORIDE 0.9 % IV SOLN
2.0000 g | Freq: Once | INTRAVENOUS | Status: AC
  Administered 2021-04-14: 2 g via INTRAVENOUS
  Filled 2021-04-14: qty 20

## 2021-04-14 MED ORDER — CHLORHEXIDINE GLUCONATE 0.12 % MT SOLN: 15.0000 mL | Freq: Once | OROMUCOSAL | Status: AC

## 2021-04-14 MED ORDER — HYDROCODONE-ACETAMINOPHEN 5-325 MG PO TABS: 1.0000 | ORAL_TABLET | ORAL | 0 refills | Status: AC | PRN

## 2021-04-14 MED ORDER — DEXAMETHASONE SODIUM PHOSPHATE 10 MG/ML IJ SOLN
INTRAMUSCULAR | Status: DC | PRN
  Administered 2021-04-14: 10 mg via INTRAVENOUS

## 2021-04-14 MED ORDER — LIDOCAINE HCL (CARDIAC) PF 100 MG/5ML IV SOSY
PREFILLED_SYRINGE | INTRAVENOUS | Status: DC | PRN
  Administered 2021-04-14: 100 mg via INTRAVENOUS

## 2021-04-14 MED ORDER — MEPERIDINE HCL 25 MG/ML IJ SOLN: 6.2500 mg | INTRAMUSCULAR | Status: DC | PRN

## 2021-04-14 MED ORDER — ONDANSETRON HCL 4 MG PO TABS: 4.0000 mg | ORAL_TABLET | Freq: Three times a day (TID) | ORAL | 0 refills | Status: DC | PRN

## 2021-04-14 SURGICAL SUPPLY — 50 items
BAG INFUSER PRESSURE 100CC (MISCELLANEOUS) IMPLANT
BLADE SURG SZ11 CARB STEEL (BLADE) ×2 IMPLANT
CANISTER SUCT 1200ML W/VALVE (MISCELLANEOUS) IMPLANT
CANNULA REDUC XI 12-8 STAPL (CANNULA) ×1
CANNULA REDUCER 12-8 DVNC XI (CANNULA) ×1 IMPLANT
CHLORAPREP W/TINT 26 (MISCELLANEOUS) ×2 IMPLANT
CLIP VESOLOCK MED LG 6/CT (CLIP) ×2 IMPLANT
COVER WAND RF STERILE (DRAPES) ×2 IMPLANT
DECANTER SPIKE VIAL GLASS SM (MISCELLANEOUS) ×4 IMPLANT
DEFOGGER SCOPE WARMER CLEARIFY (MISCELLANEOUS) ×2 IMPLANT
DERMABOND ADVANCED (GAUZE/BANDAGES/DRESSINGS) ×1
DERMABOND ADVANCED .7 DNX12 (GAUZE/BANDAGES/DRESSINGS) ×1 IMPLANT
DRAPE ARM DVNC X/XI (DISPOSABLE) ×4 IMPLANT
DRAPE COLUMN DVNC XI (DISPOSABLE) ×1 IMPLANT
DRAPE DA VINCI XI ARM (DISPOSABLE) ×4
DRAPE DA VINCI XI COLUMN (DISPOSABLE) ×1
ELECT REM PT RETURN 9FT ADLT (ELECTROSURGICAL) ×2
ELECTRODE REM PT RTRN 9FT ADLT (ELECTROSURGICAL) ×1 IMPLANT
GLOVE SURG ENC MOIS LTX SZ6.5 (GLOVE) ×6 IMPLANT
GLOVE SURG UNDER POLY LF SZ6.5 (GLOVE) ×6 IMPLANT
GOWN STRL REUS W/ TWL LRG LVL3 (GOWN DISPOSABLE) ×3 IMPLANT
GOWN STRL REUS W/TWL LRG LVL3 (GOWN DISPOSABLE) ×3
GRASPER SUT TROCAR 14GX15 (MISCELLANEOUS) ×2 IMPLANT
IRRIGATOR SUCT 8 DISP DVNC XI (IRRIGATION / IRRIGATOR) IMPLANT
IRRIGATOR SUCTION 8MM XI DISP (IRRIGATION / IRRIGATOR)
IV NS 1000ML (IV SOLUTION)
IV NS 1000ML BAXH (IV SOLUTION) IMPLANT
KIT PINK PAD W/HEAD ARE REST (MISCELLANEOUS) ×2 IMPLANT
KIT PINK PAD W/HEAD ARM REST (MISCELLANEOUS) ×1 IMPLANT
LABEL OR SOLS (LABEL) ×2 IMPLANT
MANIFOLD NEPTUNE II (INSTRUMENTS) ×2 IMPLANT
NEEDLE HYPO 22GX1.5 SAFETY (NEEDLE) ×2 IMPLANT
NEEDLE INSUFFLATION 14GA 120MM (NEEDLE) ×2 IMPLANT
NS IRRIG 500ML POUR BTL (IV SOLUTION) ×2 IMPLANT
OBTURATOR OPTICAL STANDARD 8MM (TROCAR) ×1
OBTURATOR OPTICAL STND 8 DVNC (TROCAR) ×1
OBTURATOR OPTICALSTD 8 DVNC (TROCAR) ×1 IMPLANT
PACK LAP CHOLECYSTECTOMY (MISCELLANEOUS) ×2 IMPLANT
POUCH SPECIMEN RETRIEVAL 10MM (ENDOMECHANICALS) ×2 IMPLANT
SEAL CANN UNIV 5-8 DVNC XI (MISCELLANEOUS) ×3 IMPLANT
SEAL XI 5MM-8MM UNIVERSAL (MISCELLANEOUS) ×3
SET TUBE SMOKE EVAC HIGH FLOW (TUBING) ×2 IMPLANT
SOLUTION ELECTROLUBE (MISCELLANEOUS) ×2 IMPLANT
SPONGE LAP 4X18 RFD (DISPOSABLE) IMPLANT
STAPLER CANNULA SEAL DVNC XI (STAPLE) ×1 IMPLANT
STAPLER CANNULA SEAL XI (STAPLE) ×1
SUT MNCRL 4-0 (SUTURE) ×2
SUT MNCRL 4-0 27XMFL (SUTURE) ×2
SUT VICRYL 0 AB UR-6 (SUTURE) ×4 IMPLANT
SUTURE MNCRL 4-0 27XMF (SUTURE) ×2 IMPLANT

## 2021-04-14 NOTE — Anesthesia Preprocedure Evaluation (Signed)
Anesthesia Evaluation  Patient identified by MRN, date of birth, ID band Patient awake    Reviewed: Allergy & Precautions, H&P , NPO status , Patient's Chart, lab work & pertinent test results  History of Anesthesia Complications (+) PONV and history of anesthetic complications  Airway Mallampati: II  TM Distance: >3 FB Neck ROM: full    Dental  (+) Teeth Intact   Pulmonary neg pulmonary ROS, neg shortness of breath, neg COPD,    breath sounds clear to auscultation       Cardiovascular (-) angina(-) Past MI (-) dysrhythmias + Valvular Problems/Murmurs MVP  Rhythm:regular Rate:Normal     Neuro/Psych PSYCHIATRIC DISORDERS Anxiety  Neuromuscular disease    GI/Hepatic Neg liver ROS, GERD  Controlled,  Endo/Other  negative endocrine ROS  Renal/GU      Musculoskeletal  (+) Arthritis ,   Abdominal   Peds  Hematology negative hematology ROS (+)   Anesthesia Other Findings Past Medical History: No date: Anxiety No date: Arthritis No date: Complication of anesthesia No date: GERD (gastroesophageal reflux disease) No date: PONV (postoperative nausea and vomiting)     Comment:  NAUSEA  Past Surgical History: 05/30/2018: KNEE ARTHROSCOPY WITH MEDIAL MENISECTOMY; Right     Comment:  Procedure: KNEE ARTHROSCOPY WITH MEDIAL MENISECTOMY               PARTIAL SYNOVECTOMY;  Surgeon: Hessie Knows, MD;                Location: [facility] ORS;  Service: Orthopedics;  Laterality:               Right; No date: NASAL SINUS SURGERY No date: THYROID CYST EXCISION No date: UTERINE FIBROID SURGERY     Reproductive/Obstetrics negative OB ROS                             Anesthesia Physical  Anesthesia Plan  ASA: 2  Anesthesia Plan: General   Post-op Pain Management:    Induction: Intravenous  PONV Risk Score and Plan: 4 or greater and Dexamethasone, Ondansetron, Midazolam, Treatment may vary due to age or  medical condition and Scopolamine patch - Pre-op  Airway Management Planned: Oral ETT  Additional Equipment:   Intra-op Plan:   Post-operative Plan: Extubation in OR  Informed Consent: I have reviewed the patients History and Physical, chart, labs and discussed the procedure including the risks, benefits and alternatives for the proposed anesthesia with the patient or authorized representative who has indicated his/her understanding and acceptance.     Dental Advisory Given  Plan Discussed with: Anesthesiologist, CRNA and Surgeon  Anesthesia Plan Comments:         Anesthesia Quick Evaluation

## 2021-04-14 NOTE — Anesthesia Postprocedure Evaluation (Signed)
Anesthesia Post Note  Patient: Kelsey Short  Procedure(s) Performed: XI ROBOTIC ASSISTED LAPAROSCOPIC CHOLECYSTECTOMY  Patient location during evaluation: PACU Anesthesia Type: General Level of consciousness: awake and alert Pain management: pain level controlled Vital Signs Assessment: post-procedure vital signs reviewed and stable Respiratory status: spontaneous breathing, nonlabored ventilation, respiratory function stable and patient connected to nasal cannula oxygen Cardiovascular status: blood pressure returned to baseline and stable Postop Assessment: no apparent nausea or vomiting Anesthetic complications: no   No notable events documented.   Last Vitals:  Vitals:   04/14/21 1745 04/14/21 1800  BP: (!) 142/75 (!) 127/97  Pulse: 71 76  Resp: (!) 8 14  Temp:    SpO2: 96% 99%    Last Pain:  Vitals:   04/14/21 1801  TempSrc:   PainSc: 7                  Precious Haws Zacharia Sowles

## 2021-04-14 NOTE — ED Notes (Signed)
Pt signing consent for surgery. Pt verbalizes being NPO since 7pm 04/13/21.

## 2021-04-14 NOTE — Op Note (Signed)
Preoperative diagnosis: Acute cholecystitis  Postoperative diagnosis: Acute cholecystitis  Procedure: Robotic Assisted Laparoscopic Cholecystectomy.   Anesthesia: GETA   Surgeon: Dr. Windell Moment  Wound Classification: Clean Contaminated  Indications: Patient is a 55 y.o. female developed right upper quadrant pain and on workup was found to have cholecystitis. Robotic Assisted Laparoscopic cholecystectomy was elected.  Findings: Abundant pericholecystic edema:    Critical view of safety achieved   Cystic duct and artery identified, ligated and divided Adequate hemostasis  Description of procedure: The patient was placed on the operating table in the supine position. General anesthesia was induced. A time-out was completed verifying correct patient, procedure, site, positioning, and implant(s) and/or special equipment prior to beginning this procedure. An orogastric tube was placed. The abdomen was prepped and draped in the usual sterile fashion.  An incision was made in a natural skin line below the umbilicus.  The fascia was elevated and the Veress needle inserted. Proper position was confirmed by aspiration and saline meniscus test.  The abdomen was insufflated with carbon dioxide to a pressure of 15 mmHg. The patient tolerated insufflation well. A 8-mm trocar was then inserted in optiview fashion.  The laparoscope was inserted and the abdomen inspected. No injuries from initial trocar placement were noted. Additional trocars were then inserted in the following locations: an 8-mm trocar in the left lateral abdomen, and another two 8-mm trocars to the right side of the abdomen 5 cm appart. The umbilical trocar was changed to a 12 mm trocar all under direct visualization. The abdomen was inspected and no abnormalities were found. The table was placed in the reverse Trendelenburg position with the right side up. The robotic arms were docked and target anatomy identified. Instrument  inserted under direct visualization.  Filmy adhesions between the gallbladder and omentum, duodenum and transverse colon were lysed with electrocautery. The dome of the gallbladder was grasped with a prograsp and retracted over the dome of the liver. The infundibulum was also grasped with an atraumatic grasper and retracted toward the right lower quadrant. This maneuver exposed Calot's triangle. The peritoneum overlying the gallbladder infundibulum was then incised and the cystic duct and cystic artery identified and circumferentially dissected. Critical view of safety reviewed before ligating any structure. Firefly images taken to visualize biliary ducts. The cystic duct and cystic artery were then doubly clipped and divided close to the gallbladder.  The gallbladder was then dissected from its peritoneal attachments by electrocautery. Hemostasis was checked and the gallbladder and contained stones were removed using an endoscopic retrieval bag. The gallbladder was passed off the table as a specimen. There was no evidence of bleeding from the gallbladder fossa or cystic artery or leakage of the bile from the cystic duct stump. Secondary trocars were removed under direct vision. No bleeding was noted. The robotic arms were undoked. The scope was withdrawn and the umbilical trocar removed. The abdomen was allowed to collapse. The fascia of the 18mm trocar sites was closed with figure-of-eight 0 vicryl sutures. The skin was closed with subcuticular sutures of 4-0 monocryl and topical skin adhesive. The orogastric tube was removed.  The patient tolerated the procedure well and was taken to the postanesthesia care unit in stable condition.   Specimen: Gallbladder  Complications: None  EBL: 25 mL

## 2021-04-14 NOTE — ED Triage Notes (Signed)
Patient ambulatory to triage with steady gait, without difficulty or distress noted; pt reports rt upper abd pain accomp by nausea; st hx of same with gallstones

## 2021-04-14 NOTE — H&P (Signed)
SURGICAL CONSULTATION NOTE   HISTORY OF PRESENT ILLNESS (HPI):  55 y.o. female presented to [facility] ED for evaluation of abdominal pain. Patient reports started having abdominal pain since 2 AM this morning.  The patient reported the pain was localized to the upper abdomen.  The pain then radiated to the right upper quadrant.  Pain radiated to her back.  There was no alleviating or aggravating factors.  Patient denies any fever or chills.  Patient reported that this is a second episode of acute right upper quadrant pain.  Patient came to the ED.  At the ED she was found with normal white blood cell count and normal CMP.  Ultrasound of the bladder shows multiple stone and 1 stone in the neck of the gallbladder that is non mobile.  There was positive Murphy sign as per sonographer.  I personally evaluated the images.  Surgery is consulted by Dr. Charna Archer in this context for evaluation and management of acute cholecystitis.  PAST MEDICAL HISTORY (PMH):  Past Medical History:  Diagnosis Date   Anxiety    Arthritis    Complication of anesthesia    GERD (gastroesophageal reflux disease)    PONV (postoperative nausea and vomiting)    NAUSEA     PAST SURGICAL HISTORY ([place]):  Past Surgical History:  Procedure Laterality Date   CYSTOSCOPY WITH BIOPSY N/A 12/15/2019   Procedure: CYSTOSCOPY WITH BIOPSY;  Surgeon: Hollice Espy, MD;  Location: [facility] ORS;  Service: Urology;  Laterality: N/A;   KNEE ARTHROSCOPY WITH MEDIAL MENISECTOMY Right 05/30/2018   Procedure: KNEE ARTHROSCOPY WITH MEDIAL MENISECTOMY PARTIAL SYNOVECTOMY;  Surgeon: Hessie Knows, MD;  Location: [facility] ORS;  Service: Orthopedics;  Laterality: Right;   NASAL SINUS SURGERY     THYROID CYST EXCISION     UTERINE FIBROID SURGERY       MEDICATIONS:  Prior to Admission medications   Medication Sig Start Date End Date Taking? Authorizing Provider  acetaminophen (TYLENOL) 650 MG CR tablet Take 650 mg by mouth 2 (two) times daily as needed for  pain.    [provider]  escitalopram (LEXAPRO) 20 MG tablet Take 20 mg by mouth every evening.    [provider]  Melatonin 10 MG TABS Take 10 mg by mouth daily as needed (sleep).    [provider]  ondansetron (ZOFRAN ODT) 8 MG disintegrating tablet Take 1 tablet (8 mg total) by mouth every 8 (eight) hours as needed for nausea or vomiting. 02/08/21   Arta Silence, MD  oxybutynin (DITROPAN) 5 MG tablet Take 1 tablet (5 mg total) by mouth 3 (three) times daily. 12/17/19   Hollice Espy, MD     ALLERGIES:  Allergies  Allergen Reactions   Oxycodone Nausea Only     SOCIAL HISTORY:  Social History   Socioeconomic History   Marital status: Divorced    Spouse name: Not on file   Number of children: Not on file   Years of education: Not on file   Highest education level: Not on file  Occupational History   Not on file  Tobacco Use   Smoking status: Never   Smokeless tobacco: Never  Vaping Use   Vaping Use: Never used  Substance and Sexual Activity   Alcohol use: Never   Drug use: Never   Sexual activity: Not on file  Other Topics Concern   Not on file  Social History Narrative   Not on file   Social Determinants of Health   Financial Resource Strain: Not  on file  Food Insecurity: Not on file  Transportation Needs: Not on file  Physical Activity: Not on file  Stress: Not on file  Social Connections: Not on file  Intimate Partner Violence: Not on file     FAMILY HISTORY:  Family History  Problem Relation Age of Onset   Bladder Cancer Father    Prostate cancer Paternal Uncle    Prostate cancer Paternal Uncle    Prostate cancer Paternal Grandfather      REVIEW OF SYSTEMS:  Constitutional: denies weight loss, fever, chills, or sweats  Eyes: denies any other vision changes, history of eye injury  ENT: denies sore throat, hearing problems  Respiratory: denies shortness of breath, wheezing  Cardiovascular: denies chest pain,  palpitations  Gastrointestinal: positive abdominal pain, Denies nausea and vomitnig Genitourinary: denies burning with urination or urinary frequency Musculoskeletal: denies any other joint pains or cramps  Skin: denies any other rashes or skin discolorations  Neurological: denies any other headache, dizziness, weakness  Psychiatric: denies any other depression, anxiety   All other review of systems were negative   VITAL SIGNS:  Temp:  [98.1 F (36.7 C)] 98.1 F (36.7 C) (06/16 0612) Pulse Rate:  [90] 90 (06/16 0612) Resp:  [18] 18 (06/16 0612) BP: (149)/(88) 149/88 (06/16 0612) SpO2:  [100 %] 100 % (06/16 0612) Weight:  [83 kg] 83 kg (06/16 0609)     Height: 6\' 2"  (188 cm) Weight: 83 kg BMI (Calculated): 23.49   INTAKE/OUTPUT:  This shift: No intake/output data recorded.  Last 2 shifts: @IOLAST2SHIFTS @   PHYSICAL EXAM:  Constitutional:  -- Normal body habitus  -- Awake, alert, and oriented x3  Eyes:  -- Pupils equally round and reactive to light  -- No scleral icterus  Ear, nose, and throat:  -- No jugular venous distension  Pulmonary:  -- No crackles  -- Equal breath sounds bilaterally -- Breathing non-labored at rest Cardiovascular:  -- S1, S2 present  -- No pericardial rubs Gastrointestinal:  -- Abdomen soft, tender in the right upper quadrant, non-distended, no guarding or rebound tenderness -- No abdominal masses appreciated, pulsatile or otherwise  Musculoskeletal and Integumentary:  -- Wounds: None appreciated -- Extremities: B/L UE and LE FROM, hands and feet warm, no edema  Neurologic:  -- Motor function: intact and symmetric -- Sensation: intact and symmetric   Labs:  CBC Latest Ref Rng & Units 04/14/2021 02/08/2021  WBC 4.0 - 10.5 K/uL 8.8 7.4  Hemoglobin 12.0 - 15.0 g/dL 14.2 13.7  Hematocrit 36.0 - 46.0 % 41.2 40.1  Platelets 150 - 400 K/uL 270 237   CMP Latest Ref Rng & Units 04/14/2021 02/08/2021  Glucose 70 - 99 mg/dL 129(H) 124(H)  BUN 6 - 20  mg/dL 14 12  Creatinine 0.44 - 1.00 mg/dL 0.87 0.81  Sodium 135 - 145 mmol/L 139 139  Potassium 3.5 - 5.1 mmol/L 3.6 3.3(L)  Chloride 98 - 111 mmol/L 104 103  CO2 22 - 32 mmol/L 28 27  Calcium 8.9 - 10.3 mg/dL 9.3 8.7(L)  Total Protein 6.5 - 8.1 g/dL 7.2 7.1  Total Bilirubin 0.3 - 1.2 mg/dL 0.6 0.6  Alkaline Phos 38 - 126 U/L 70 65  AST 15 - 41 U/L 19 26  ALT 0 - 44 U/L 15 24    Imaging studies:  EXAM: ULTRASOUND ABDOMEN LIMITED RIGHT UPPER QUADRANT   COMPARISON:  Ultrasound 02/08/2021. CT Abdomen and Pelvis 11/18/2019.   FINDINGS: Gallbladder:   Multiple echogenic gallstones individually estimated up to  18 mm. Evidence of a 16 mm stone lodged in the neck of the gallbladder now (image 3). Increased gallbladder wall thickening and positive sonographic Murphy sign. No pericholecystic fluid. Superimposed sludge.   Common bile duct:   Diameter: 5 mm, stable and within normal limits.   Liver:   No focal lesion identified. Within normal limits in parenchymal echogenicity. Portal vein is patent on color Doppler imaging with normal direction of blood flow towards the liver.   Other: Negative visible right kidney.   IMPRESSION: 1. Positive for Acute Cholecystitis, with a 16 mm stone lodged in the neck of the gallbladder now. 2. No evidence of bile duct obstruction.     Electronically Signed   By: Genevie Ann M.D.   On: 04/14/2021 07:42  Assessment/Plan:  55 y.o. female with acute cholecystitis.  Patient with history, physical exam and images consistent with acute cholecystitis. Patient oriented about diagnosis and surgical management as treatment.   Discussed the risk of surgery including post-op infxn, seroma, biloma, chronic pain, poor-delayed wound healing, retained gallstone, conversion to open procedure, post-op SBO or ileus, and need for additional procedures to address said risks.  The risks of general anesthetic including MI, CVA, sudden death or even reaction to  anesthetic medications also discussed. Alternatives include continued observation.  Benefits include possible symptom relief, prevention of complications including acute cholecystitis, pancreatitis.  Arnold Long, MD

## 2021-04-14 NOTE — ED Notes (Signed)
Patient transported to Ultrasound 

## 2021-04-14 NOTE — ED Provider Notes (Addendum)
Assumed care from Dr. Leonides Schanz at 7 AM. Briefly, the patient is a 55 y.o. female with PMHx of  has a past medical history of Anxiety, Arthritis, Complication of anesthesia, GERD (gastroesophageal reflux disease), and PONV (postoperative nausea and vomiting). here with RUQ pain.   Labs Reviewed  CBC WITH DIFFERENTIAL/PLATELET - Abnormal; Notable for the following components:      Result Value   Lymphs Abs 4.1 (*)    All other components within normal limits  COMPREHENSIVE METABOLIC PANEL - Abnormal; Notable for the following components:   Glucose, Bld 129 (*)    All other components within normal limits  LIPASE, BLOOD  URINALYSIS, COMPLETE (UACMP) WITH MICROSCOPIC  POC URINE PREG, ED    Course of Care: -U/S shows acute cholecystitis. Pt with ongoing pain, nausea, vomiting. Will consult surgery for admission. COVID sent. Rocephin/Flagyl ordered. Pt is HDS, non-toxic without signs of sepsis. -Patient updated, COVID is pending.  Dr. Bary Castilla has seen the patient, patient will be admitted for surgery.   Duffy Bruce, MD 04/14/21 2440    Duffy Bruce, MD 04/14/21 [phone]

## 2021-04-14 NOTE — Discharge Instructions (Addendum)

## 2021-04-14 NOTE — Anesthesia Procedure Notes (Signed)
Procedure Name: Intubation Date/Time: 04/14/2021 2:50 PM Performed by: Aline Brochure, CRNA Pre-anesthesia Checklist: Patient identified, Patient being monitored, Timeout performed, Emergency Drugs available and Suction available Patient Re-evaluated:Patient Re-evaluated prior to induction Oxygen Delivery Method: Circle system utilized Preoxygenation: Pre-oxygenation with 100% oxygen Induction Type: IV induction Ventilation: Mask ventilation without difficulty Laryngoscope Size: 3 and McGraph Grade View: Grade I Tube type: Oral Tube size: 7.0 mm Number of attempts: 1 Airway Equipment and Method: Stylet and Video-laryngoscopy Placement Confirmation: ETT inserted through vocal cords under direct vision, positive ETCO2 and breath sounds checked- equal and bilateral Secured at: 21 cm Tube secured with: Tape Dental Injury: Teeth and Oropharynx as per pre-operative assessment

## 2021-04-14 NOTE — ED Provider Notes (Signed)
[facility] Emergency Department Provider Note  ____________________________________________   Event Date/Time   First MD Initiated Contact with Patient 04/14/21 0617     (approximate)  I have reviewed the triage vital signs and the nursing notes.   HISTORY  Chief Complaint Abdominal Pain    HPI Kelsey Short is a 55 y.o. female with history of gallstones who presents to the emergency department complaints of sharp, crampy right upper quadrant abdominal pain without radiation that started at 2 AM tonight.  Has associated nausea without vomiting.  No diarrhea.  No fever.  No chest pain or shortness of breath.  States similar symptoms with biliary colic before.  Diagnosed with gallstones in April 2022.  Has been seen by Dr. Bary Castilla with general surgery.  States she is not on the schedule for cholecystectomy as she had only had 1 episode before today.  States she has been eating a very bland diet board for lunch today she ate a [place] dip sandwich.  Denies dysuria, hematuria, vaginal bleeding or discharge.  No previous abdominal surgeries.  Reports she took Zofran at home prior to arrival without significant relief.        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   Low serum vitamin D 03/31/2016   B12 deficiency 03/16/2015   Other symptoms involving urinary system(788.99) 06/11/2013   Incomplete emptying of bladder 06/11/2013   Chronic cystitis 06/11/2013    Past Surgical  History:  Procedure Laterality Date   CYSTOSCOPY WITH BIOPSY N/A 12/15/2019   Procedure: CYSTOSCOPY WITH BIOPSY;  Surgeon: Hollice Espy, MD;  Location: [facility] ORS;  Service: Urology;  Laterality: N/A;   KNEE ARTHROSCOPY WITH MEDIAL MENISECTOMY Right 05/30/2018   Procedure: KNEE ARTHROSCOPY WITH MEDIAL MENISECTOMY PARTIAL SYNOVECTOMY;  Surgeon: Hessie Knows, MD;  Location: [facility] ORS;  Service: Orthopedics;  Laterality: Right;   NASAL SINUS SURGERY     THYROID CYST EXCISION     UTERINE FIBROID SURGERY      Prior to Admission medications   Medication Sig Start Date End Date Taking? Authorizing Provider  acetaminophen (TYLENOL) 650 MG CR tablet Take 650 mg by mouth 2 (two) times daily as needed for pain.    [provider]  escitalopram (LEXAPRO) 20 MG tablet Take 20 mg by mouth every evening.    [provider]  Melatonin 10 MG TABS Take 10 mg by mouth daily as needed (sleep).    [provider]  ondansetron (ZOFRAN ODT) 8 MG disintegrating tablet Take 1 tablet (8 mg total) by mouth every 8 (eight) hours as needed for nausea or vomiting. 02/08/21   Arta Silence, MD  oxybutynin (DITROPAN) 5 MG tablet Take 1 tablet (5 mg total) by mouth 3 (three) times daily. 12/17/19   Hollice Espy, MD    Allergies Oxycodone  Family History  Problem Relation Age of Onset   Bladder Cancer Father    Prostate cancer Paternal Uncle    Prostate cancer Paternal Uncle  Prostate cancer Paternal Grandfather     Social History Social History   Tobacco Use   Smoking status: Never   Smokeless tobacco: Never  Vaping Use   Vaping Use: Never used  Substance Use Topics   Alcohol use: Never   Drug use: Never    Review of Systems Constitutional: No fever. Eyes: No visual changes. ENT: No sore throat. Cardiovascular: Denies chest pain. Respiratory: Denies shortness of breath. Gastrointestinal: + nausea.  No vomiting, diarrhea. Genitourinary: Negative for  dysuria. Musculoskeletal: Negative for back pain. Skin: Negative for rash. Neurological: Negative for focal weakness or numbness.  ____________________________________________   PHYSICAL EXAM:  VITAL SIGNS: ED Triage Vitals  Enc Vitals Group     BP 04/14/21 0612 (!) 149/88     Pulse Rate 04/14/21 0612 90     Resp 04/14/21 0612 18     Temp 04/14/21 0612 98.1 F (36.7 C)     Temp Source 04/14/21 0612 Oral     SpO2 04/14/21 0612 100 %     Weight 04/14/21 0609 183 lb (83 kg)     Height 04/14/21 0609 6\' 2"  (1.88 m)     Head Circumference --      Peak Flow --      Pain Score 04/14/21 0608 8     Pain Loc --      Pain Edu? --      Excl. in [place]? --    CONSTITUTIONAL: Alert and oriented and responds appropriately to questions.  Appears uncomfortable, afebrile HEAD: Normocephalic EYES: Conjunctivae clear, pupils appear equal, EOM appear intact ENT: normal nose; moist mucous membranes NECK: Supple, normal ROM CARD: RRR; S1 and S2 appreciated; no murmurs, no clicks, no rubs, no gallops RESP: Normal chest excursion without splinting or tachypnea; breath sounds clear and equal bilaterally; no wheezes, no rhonchi, no rales, no hypoxia or respiratory distress, speaking full sentences ABD/GI: Normal bowel sounds; non-distended; soft, tender to palpation in the right upper quadrant without guarding, rebound, negative Murphy sign, minimal tenderness at McBurney's point BACK: The back appears normal EXT: Normal ROM in all joints; no deformity noted, no edema; no cyanosis SKIN: Normal color for age and race; warm; no rash on exposed skin NEURO: Moves all extremities equally PSYCH: The patient's mood and manner are appropriate.  ____________________________________________   LABS (all labs ordered are listed, but only abnormal results are displayed)  Labs Reviewed  CBC WITH DIFFERENTIAL/PLATELET - Abnormal; Notable for the following components:      Result Value   Lymphs Abs 4.1 (*)     All other components within normal limits  COMPREHENSIVE METABOLIC PANEL - Abnormal; Notable for the following components:   Glucose, Bld 129 (*)    All other components within normal limits  LIPASE, BLOOD  URINALYSIS, COMPLETE (UACMP) WITH MICROSCOPIC  POC URINE PREG, ED   ____________________________________________  EKG   ____________________________________________  RADIOLOGY I, Anorah Trias, personally viewed and evaluated these images (plain radiographs) as part of my medical decision making, as well as reviewing the written report by the radiologist.  ED MD interpretation:    Official radiology report(s): No results found.  ____________________________________________   PROCEDURES  Procedure(s) performed (including Critical Care):  Procedures  ____________________________________________   INITIAL IMPRESSION / ASSESSMENT AND PLAN / ED COURSE  As part of my medical decision making, I reviewed the following data within the [place] notes reviewed and incorporated, Labs reviewed , Old chart reviewed, Patient signed out to Dr. Myrene Buddy, Notes  from prior ED visits, and [place] Controlled Substance Database         Patient here with right upper quadrant abdominal pain.  Suspect symptoms due to biliary colic.  Will repeat ultrasound to ensure no signs of cholecystitis, choledocholithiasis today.  Doubt cholangitis.  Differential also includes pancreatitis.  She does have very minimal tenderness at McBurney's point but my suspicion for appendicitis is low.  I suspect this is referred pain from her gallbladder.  She is afebrile, nontoxic-appearing.  Will obtain labs, urine.  Will give IV fluids, pain and nausea medicine.  Patient drove herself to the emergency department but states if she is discharged she will be able to get her brother to drive her home.  ED PROGRESS  7:14 AM  Pt's labs reassuring.  Normal LFTs, lipase.  No leukocytosis.  Ultrasound  pending.  Signed out to oncoming ED physician.  I reviewed all nursing notes and pertinent previous records as available.  I have reviewed and interpreted any EKGs, lab and urine results, imaging (as available).  ____________________________________________   FINAL CLINICAL IMPRESSION(S) / ED DIAGNOSES  Final diagnoses:  RUQ abdominal pain  Multiple gallstones     ED Discharge Orders     None       *Please note:  Junnie Loschiavo was evaluated in Emergency Department on 04/14/2021 for the symptoms described in the history of present illness. She was evaluated in the context of the global COVID-19 pandemic, which necessitated consideration that the patient might be at risk for infection with the SARS-CoV-2 virus that causes COVID-19. Institutional protocols and algorithms that pertain to the evaluation of patients at risk for COVID-19 are in a state of rapid change based on information released by regulatory bodies including the CDC and federal and state organizations. These policies and algorithms were followed during the patient's care in the ED.  Some ED evaluations and interventions may be delayed as a result of limited staffing during and the pandemic.*   Note:  This document was prepared using Dragon voice recognition software and may include unintentional dictation errors.    Nikcole Eischeid, Delice Bison, DO 04/14/21 [phone]

## 2021-04-14 NOTE — Transfer of Care (Signed)
Immediate Anesthesia Transfer of Care Note  Patient: Kelsey Short  Procedure(s) Performed: XI ROBOTIC ASSISTED LAPAROSCOPIC CHOLECYSTECTOMY  Patient Location: PACU  Anesthesia Type:General  Level of Consciousness: awake, alert  and oriented  Airway & Oxygen Therapy: Patient Spontanous Breathing  Post-op Assessment: Report given to RN and Post -op Vital signs reviewed and stable  Post vital signs: Reviewed and stable  Last Vitals:  Vitals Value Taken Time  BP 146/65 04/14/21 1702  Temp    Pulse 86 04/14/21 1704  Resp 24 04/14/21 1704  SpO2 100 % 04/14/21 1704  Vitals shown include unvalidated device data.  Last Pain:  Vitals:   04/14/21 1159  TempSrc: Oral  PainSc: 0-No pain         Complications: No notable events documented.

## 2021-04-18 LAB — SURGICAL PATHOLOGY

## 2021-04-29 IMAGING — CT CT ABD-PEL WO/W CM
3 of 12 series · 12 of 46 positions shown, 18 images · IV contrast (omnipaque)
Comparison: None.

CLINICAL DATA: Microscopic hematuria. Dysuria.

EXAM:
CT ABDOMEN AND PELVIS WITHOUT AND WITH CONTRAST
TECHNIQUE: Multidetector CT imaging of the abdomen and pelvis was performed
following the standard protocol before and following the bolus
administration of intravenous contrast.
CONTRAST:  125mL OMNIPAQUE IOHEXOL 300 MG/ML  SOLN

[Series 2: abd without pre 5.00 · axial · non-contrast · 0.69mm/px · z∈[-1542,-1397]mm · 3 of 103 slices shown]
[im 15/103  soft-tissue]
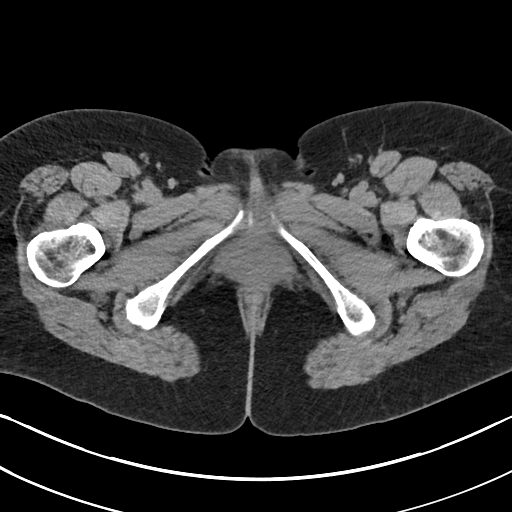
[im 30/103  soft-tissue]
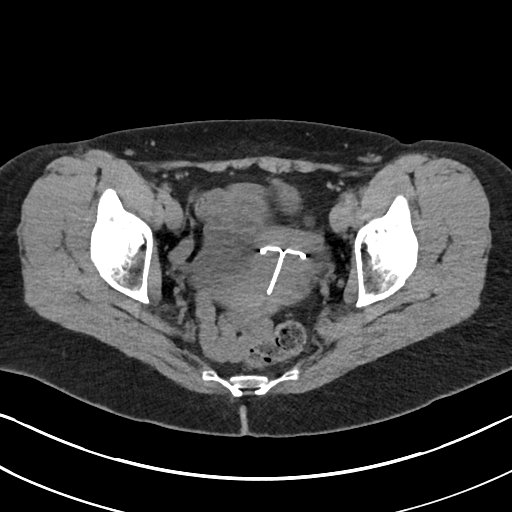
[im 44/103  soft-tissue]
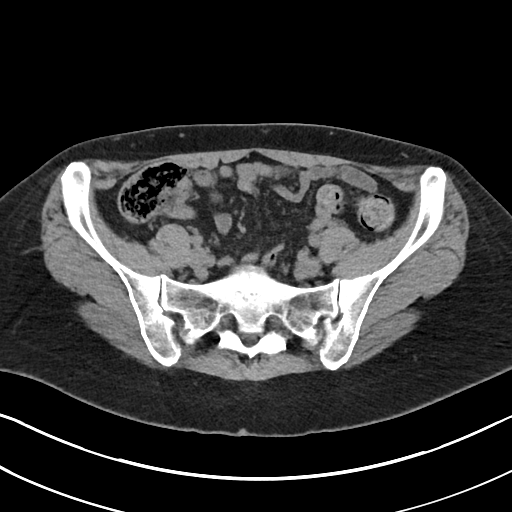

[Series 9: axial with hematuria with 5.00 · axial · 0.69mm/px · z∈[-1552,-1167]mm · 7 of 103 slices shown, 12 images]
[im 13/103  soft-tissue]
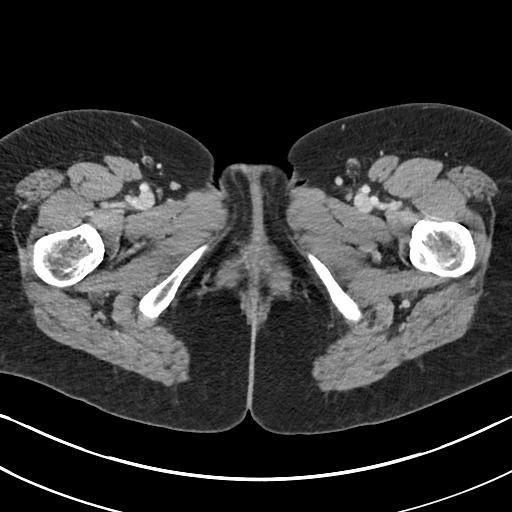
[im 13/103  bone]
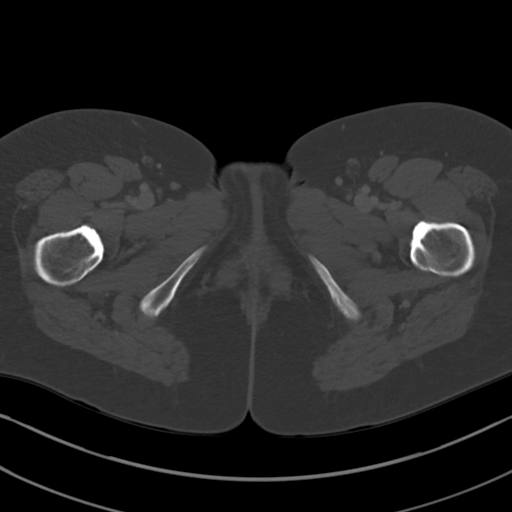
[im 26/103  soft-tissue]
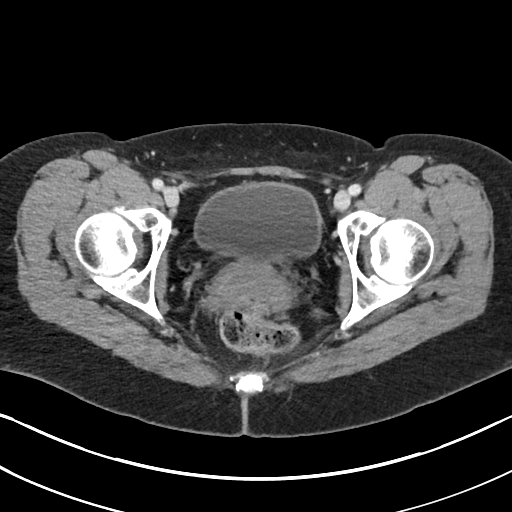
[im 39/103  soft-tissue]
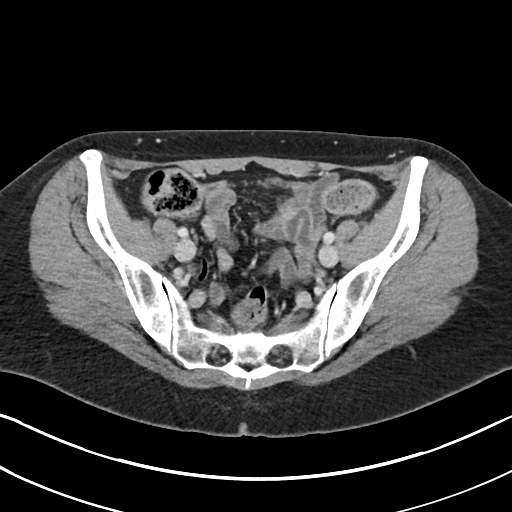
[im 52/103  soft-tissue]
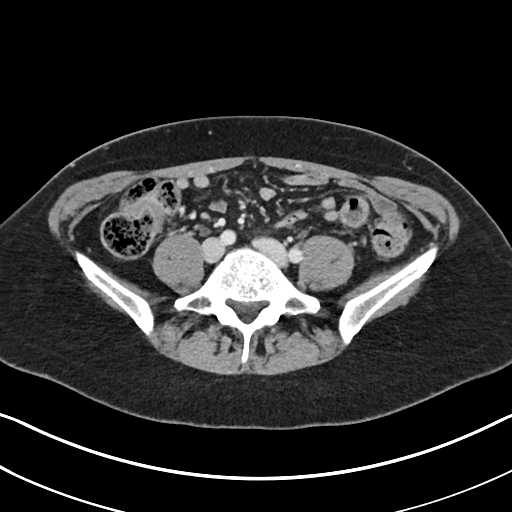
[im 52/103  lung]
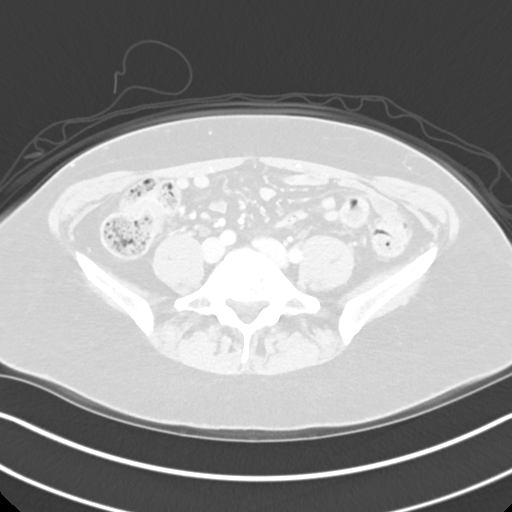
[im 64/103  soft-tissue]
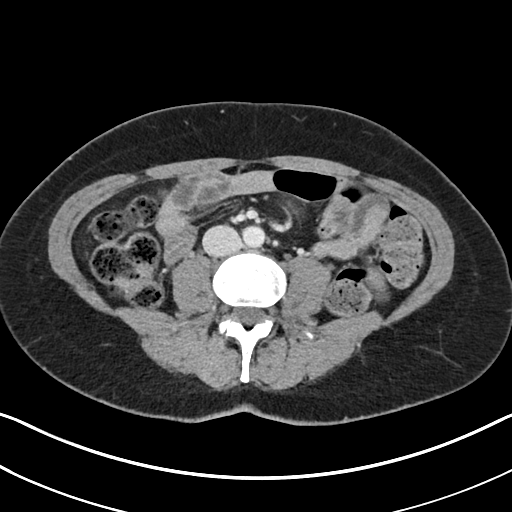
[im 64/103  lung]
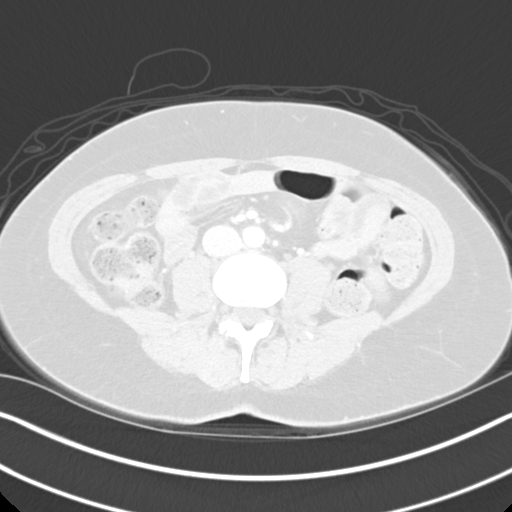
[im 77/103  soft-tissue]
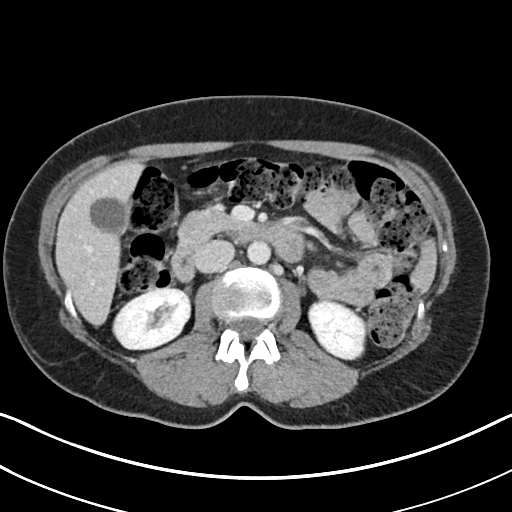
[im 77/103  lung]
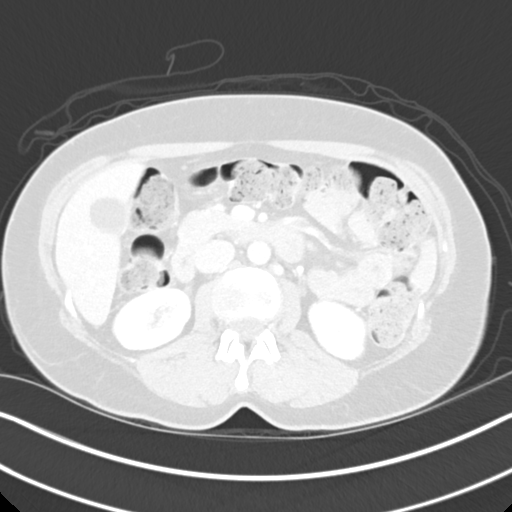
[im 90/103  soft-tissue]
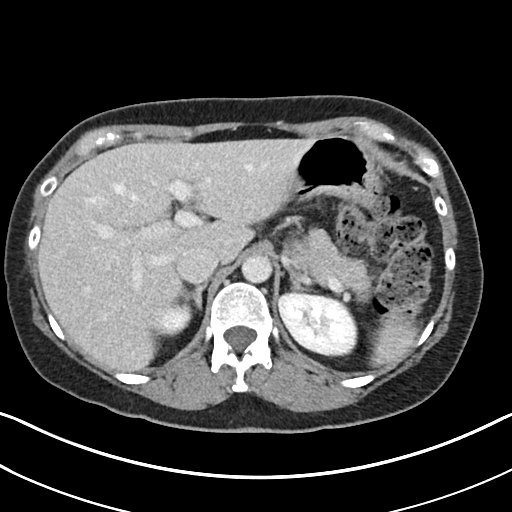
[im 90/103  lung]
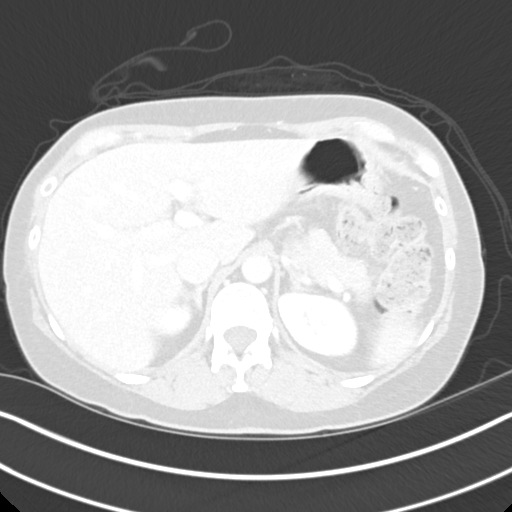

[Series 22: cor delay delay prone 2.00 cor · coronal · delayed · 0.69mm/px · 2 of 177 slices shown, 3 images]
[im 59/177  soft-tissue]
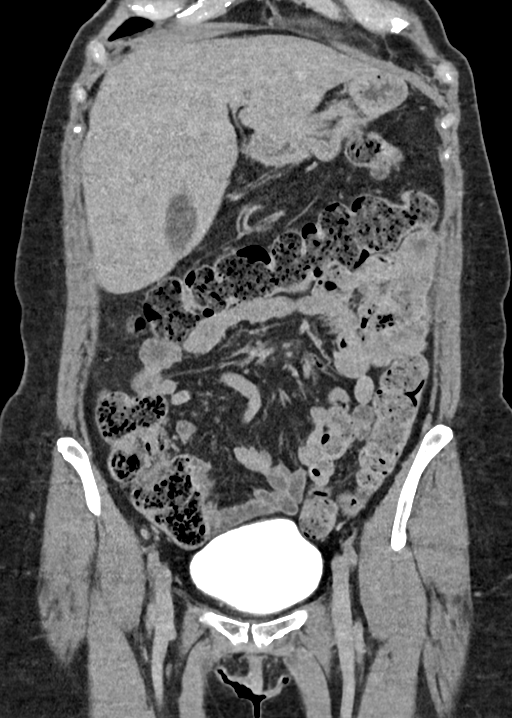
[im 59/177  bone]
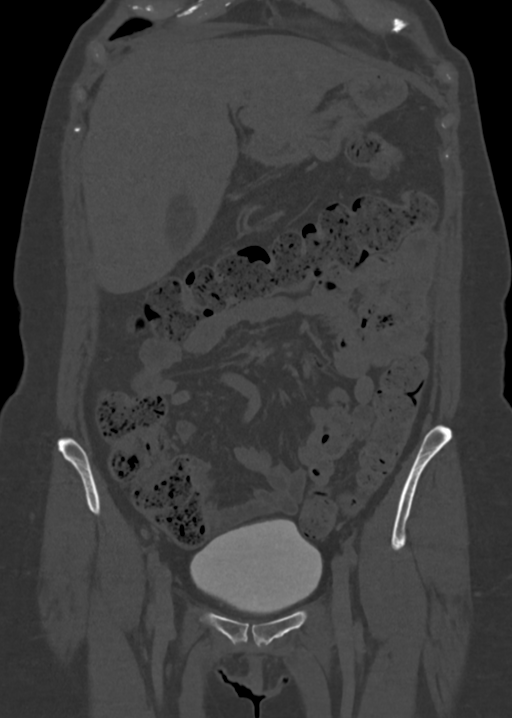
[im 118/177  soft-tissue]
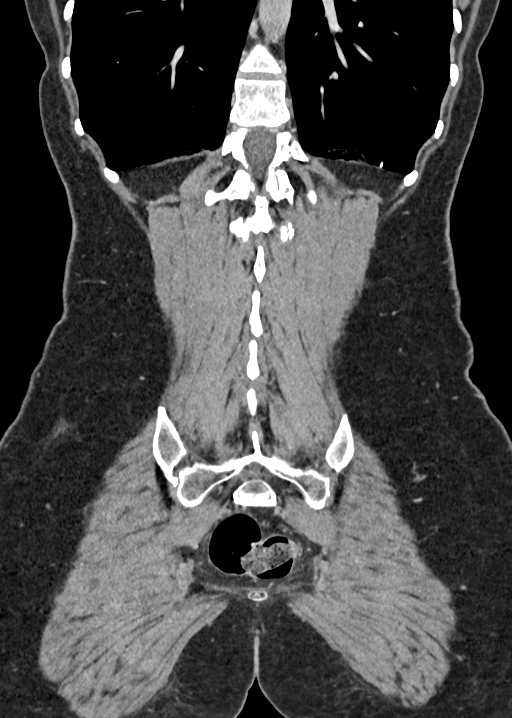

[12 of 46 positions shown; findings below may reference images not displayed]

FINDINGS: Lower Chest: No acute findings.

Hepatobiliary: No hepatic masses identified. Gallbladder is
unremarkable. No evidence of biliary ductal dilatation.

Pancreas:  No mass or inflammatory changes.

Spleen: Within normal limits in size and appearance.

Adrenals/Urinary Tract: No adrenal masses identified. No evidence of
urolithiasis or hydronephrosis. No cystic or solid renal masses
identified. No masses seen involving the ureters or bladder.

Stomach/Bowel: No evidence of obstruction, inflammatory process or
abnormal fluid collections. Normal appendix visualized.

Vascular/Lymphatic: No pathologically enlarged lymph nodes. No
abdominal aortic aneurysm.

Reproductive: IUD in appropriate position. No mass or other
significant abnormality.

Other:  None.

Musculoskeletal:  No suspicious bone lesions identified.
IMPRESSION: Negative. No radiographic evidence of urinary tract neoplasm,
urolithiasis, or hydronephrosis.

## 2022-05-06 ENCOUNTER — Other Ambulatory Visit: Payer: Self-pay

## 2022-05-06 ENCOUNTER — Emergency Department: Payer: BC Managed Care – PPO

## 2022-05-06 ENCOUNTER — Inpatient Hospital Stay
Admission: EM | Admit: 2022-05-06 | Discharge: 2022-05-08 | DRG: 684 | Disposition: A | Discharge status: Home / Self Care | Payer: BC Managed Care – PPO | Source: Ambulatory Visit | Attending: Internal Medicine | Admitting: Internal Medicine

## 2022-05-06 DIAGNOSIS — D638 Anemia in other chronic diseases classified elsewhere: Secondary | ICD-10-CM | POA: Diagnosis present

## 2022-05-06 DIAGNOSIS — B9620 Unspecified Escherichia coli [E. coli] as the cause of diseases classified elsewhere: Secondary | ICD-10-CM | POA: Diagnosis present

## 2022-05-06 DIAGNOSIS — Z791 Long term (current) use of non-steroidal anti-inflammatories (NSAID): Secondary | ICD-10-CM | POA: Diagnosis not present

## 2022-05-06 DIAGNOSIS — F39 Unspecified mood [affective] disorder: Secondary | ICD-10-CM | POA: Diagnosis present

## 2022-05-06 DIAGNOSIS — Z79899 Other long term (current) drug therapy: Secondary | ICD-10-CM

## 2022-05-06 DIAGNOSIS — N3021 Other chronic cystitis with hematuria: Secondary | ICD-10-CM | POA: Diagnosis present

## 2022-05-06 DIAGNOSIS — D649 Anemia, unspecified: Secondary | ICD-10-CM | POA: Diagnosis present

## 2022-05-06 DIAGNOSIS — N3281 Overactive bladder: Secondary | ICD-10-CM | POA: Diagnosis present

## 2022-05-06 DIAGNOSIS — M1990 Unspecified osteoarthritis, unspecified site: Secondary | ICD-10-CM | POA: Diagnosis present

## 2022-05-06 DIAGNOSIS — N170 Acute kidney failure with tubular necrosis: Secondary | ICD-10-CM | POA: Diagnosis present

## 2022-05-06 DIAGNOSIS — K529 Noninfective gastroenteritis and colitis, unspecified: Secondary | ICD-10-CM | POA: Diagnosis present

## 2022-05-06 DIAGNOSIS — M179 Osteoarthritis of knee, unspecified: Secondary | ICD-10-CM | POA: Diagnosis present

## 2022-05-06 DIAGNOSIS — F419 Anxiety disorder, unspecified: Secondary | ICD-10-CM | POA: Diagnosis present

## 2022-05-06 DIAGNOSIS — E8889 Other specified metabolic disorders: Secondary | ICD-10-CM | POA: Diagnosis present

## 2022-05-06 DIAGNOSIS — N179 Acute kidney failure, unspecified: Secondary | ICD-10-CM | POA: Diagnosis present

## 2022-05-06 DIAGNOSIS — K279 Peptic ulcer, site unspecified, unspecified as acute or chronic, without hemorrhage or perforation: Secondary | ICD-10-CM | POA: Diagnosis present

## 2022-05-06 DIAGNOSIS — E860 Dehydration: Secondary | ICD-10-CM | POA: Diagnosis present

## 2022-05-06 DIAGNOSIS — N182 Chronic kidney disease, stage 2 (mild): Secondary | ICD-10-CM | POA: Diagnosis present

## 2022-05-06 DIAGNOSIS — K219 Gastro-esophageal reflux disease without esophagitis: Secondary | ICD-10-CM | POA: Diagnosis present

## 2022-05-06 DIAGNOSIS — Z885 Allergy status to narcotic agent status: Secondary | ICD-10-CM | POA: Diagnosis not present

## 2022-05-06 DIAGNOSIS — Z79890 Hormone replacement therapy (postmenopausal): Secondary | ICD-10-CM

## 2022-05-06 DIAGNOSIS — E538 Deficiency of other specified B group vitamins: Secondary | ICD-10-CM | POA: Diagnosis present

## 2022-05-06 DIAGNOSIS — K920 Hematemesis: Secondary | ICD-10-CM | POA: Diagnosis not present

## 2022-05-06 DIAGNOSIS — N2581 Secondary hyperparathyroidism of renal origin: Secondary | ICD-10-CM | POA: Diagnosis present

## 2022-05-06 DIAGNOSIS — Z9049 Acquired absence of other specified parts of digestive tract: Secondary | ICD-10-CM

## 2022-05-06 DIAGNOSIS — Z87440 Personal history of urinary (tract) infections: Secondary | ICD-10-CM

## 2022-05-06 DIAGNOSIS — M1710 Unilateral primary osteoarthritis, unspecified knee: Secondary | ICD-10-CM | POA: Diagnosis present

## 2022-05-06 DIAGNOSIS — N390 Urinary tract infection, site not specified: Secondary | ICD-10-CM | POA: Diagnosis not present

## 2022-05-06 DIAGNOSIS — I10 Essential (primary) hypertension: Secondary | ICD-10-CM | POA: Diagnosis not present

## 2022-05-06 DIAGNOSIS — I129 Hypertensive chronic kidney disease with stage 1 through stage 4 chronic kidney disease, or unspecified chronic kidney disease: Secondary | ICD-10-CM | POA: Diagnosis present

## 2022-05-06 DIAGNOSIS — R809 Proteinuria, unspecified: Secondary | ICD-10-CM | POA: Diagnosis present

## 2022-05-06 LAB — GASTROINTESTINAL PANEL BY PCR, STOOL (REPLACES STOOL CULTURE)

## 2022-05-06 LAB — URINALYSIS, COMPLETE (UACMP) WITH MICROSCOPIC
Specific Gravity, Urine: 1.011 (ref 1.005–1.030)
WBC, UA: >50 WBC/hpf — ABNORMAL HIGH (ref 0–5)

## 2022-05-06 LAB — PROTEIN / CREATININE RATIO, URINE
Creatinine, Urine: 69 mg/dL
Protein Creatinine Ratio: 0.14 mg/mg{Cre} (ref 0.00–0.15)
Total Protein, Urine: 10 mg/dL

## 2022-05-06 LAB — LACTOFERRIN, FECAL, QUALITATIVE: Lactoferrin, Fecal, Qual: NEGATIVE

## 2022-05-06 LAB — CBC WITH DIFFERENTIAL/PLATELET
Abs Immature Granulocytes: 0.03 10*3/uL (ref 0.00–0.07)
Basophils Absolute: 0.1 10*3/uL (ref 0.0–0.1)
Basophils Relative: 1 %
Eosinophils Absolute: 0.1 10*3/uL (ref 0.0–0.5)
Eosinophils Relative: 1 %
HCT: 34.9 % — ABNORMAL LOW (ref 36.0–46.0)
Hemoglobin: 11.2 g/dL — ABNORMAL LOW (ref 12.0–15.0)
Immature Granulocytes: 0 %
Lymphocytes Relative: 20 %
Lymphs Abs: 1.9 10*3/uL (ref 0.7–4.0)
MCH: 30.6 pg (ref 26.0–34.0)
MCHC: 32.1 g/dL (ref 30.0–36.0)
MCV: 95.4 fL (ref 80.0–100.0)
Monocytes Absolute: 0.8 10*3/uL (ref 0.1–1.0)
Monocytes Relative: 9 %
Neutro Abs: 6.3 10*3/uL (ref 1.7–7.7)
Neutrophils Relative %: 69 %
Platelets: 265 10*3/uL (ref 150–400)
RBC: 3.66 MIL/uL — ABNORMAL LOW (ref 3.87–5.11)
RDW: 12.1 % (ref 11.5–15.5)
WBC: 9.2 10*3/uL (ref 4.0–10.5)
nRBC: 0 % (ref 0.0–0.2)

## 2022-05-06 LAB — BASIC METABOLIC PANEL
Anion gap: 12 (ref 5–15)
BUN: 38 mg/dL — ABNORMAL HIGH (ref 6–20)
CO2: 24 mmol/L (ref 22–32)
Calcium: 8.7 mg/dL — ABNORMAL LOW (ref 8.9–10.3)
Chloride: 103 mmol/L (ref 98–111)
Creatinine, Ser: 2.88 mg/dL — ABNORMAL HIGH (ref 0.44–1.00)
GFR, Estimated: 19 mL/min — ABNORMAL LOW (ref >60)
Glucose, Bld: 88 mg/dL (ref 70–99)
Potassium: 4.4 mmol/L (ref 3.5–5.1)
Sodium: 139 mmol/L (ref 135–145)

## 2022-05-06 LAB — BLOOD GAS, VENOUS
Acid-base deficit: 2 mmol/L (ref 0.0–2.0)
Bicarbonate: 25.1 mmol/L (ref 20.0–28.0)
O2 Saturation: 86.8 %
Patient temperature: 37
pCO2, Ven: 51 mmHg (ref 44–60)
pH, Ven: 7.3 (ref 7.25–7.43)
pO2, Ven: 66 mmHg — ABNORMAL HIGH (ref 32–45)

## 2022-05-06 LAB — TSH: TSH: 1.387 u[IU]/mL (ref 0.350–4.500)

## 2022-05-06 LAB — HEMOGLOBIN A1C
Hgb A1c MFr Bld: 4.1 % — ABNORMAL LOW (ref 4.8–5.6)
Mean Plasma Glucose: 70.97 mg/dL

## 2022-05-06 LAB — LACTIC ACID, PLASMA
Lactic Acid, Venous: 1.2 mmol/L (ref 0.5–1.9)
Lactic Acid, Venous: 1.2 mmol/L (ref 0.5–1.9)

## 2022-05-06 MED ORDER — SUCRALFATE 1 GM/10ML PO SUSP
2.0000 g | Freq: Three times a day (TID) | ORAL | Status: DC
  Administered 2022-05-06: 2 g via ORAL
  Filled 2022-05-06: qty 20

## 2022-05-06 MED ORDER — TRAMADOL HCL 50 MG PO TABS
50.0000 mg | ORAL_TABLET | Freq: Three times a day (TID) | ORAL | Status: DC | PRN
  Administered 2022-05-07 (×2): 50 mg via ORAL
  Filled 2022-05-06 (×2): qty 1

## 2022-05-06 MED ORDER — ALBUTEROL SULFATE (2.5 MG/3ML) 0.083% IN NEBU: 2.5000 mg | INHALATION_SOLUTION | RESPIRATORY_TRACT | Status: DC | PRN

## 2022-05-06 MED ORDER — ONDANSETRON HCL 4 MG PO TABS: 4.0000 mg | ORAL_TABLET | Freq: Four times a day (QID) | ORAL | Status: DC | PRN

## 2022-05-06 MED ORDER — ONDANSETRON HCL 4 MG/2ML IJ SOLN
4.0000 mg | Freq: Four times a day (QID) | INTRAMUSCULAR | Status: DC | PRN
  Administered 2022-05-06: 4 mg via INTRAVENOUS
  Filled 2022-05-06: qty 2

## 2022-05-06 MED ORDER — SODIUM CHLORIDE 0.9 % IV SOLN
1.0000 g | Freq: Once | INTRAVENOUS | Status: AC
  Administered 2022-05-06: 1 g via INTRAVENOUS
  Filled 2022-05-06: qty 10

## 2022-05-06 MED ORDER — OXYBUTYNIN CHLORIDE 5 MG PO TABS: 5.0000 mg | ORAL_TABLET | Freq: Three times a day (TID) | ORAL | Status: DC | PRN

## 2022-05-06 MED ORDER — SODIUM CHLORIDE 0.9 % IV SOLN
1.0000 g | INTRAVENOUS | Status: DC
  Administered 2022-05-07 – 2022-05-08 (×2): 1 g via INTRAVENOUS
  Filled 2022-05-06: qty 1
  Filled 2022-05-06: qty 10

## 2022-05-06 MED ORDER — METRONIDAZOLE 500 MG/100ML IV SOLN
500.0000 mg | Freq: Two times a day (BID) | INTRAVENOUS | Status: DC
Start: 2022-05-06 — End: 2022-05-08
  Administered 2022-05-06 – 2022-05-08 (×4): 500 mg via INTRAVENOUS
  Filled 2022-05-06 (×5): qty 100

## 2022-05-06 MED ORDER — SODIUM CHLORIDE 0.9 % IV SOLN: INTRAVENOUS | Status: DC

## 2022-05-06 MED ORDER — PANTOPRAZOLE SODIUM 40 MG IV SOLR
40.0000 mg | Freq: Two times a day (BID) | INTRAVENOUS | Status: DC
  Administered 2022-05-06 – 2022-05-08 (×4): 40 mg via INTRAVENOUS
  Filled 2022-05-06 (×4): qty 10

## 2022-05-06 NOTE — ED Provider Notes (Signed)
[facility] Provider Note    Event Date/Time   First MD Initiated Contact with Patient 05/06/22 1151     (approximate)  History   Chief Complaint: Abnormal Labs  HPI  Libertie Hausler is a 56 y.o. female with a past medical history of anxiety, gastric reflux, presents to the emergency department for worsening renal function.  Patient states for the past 6 months she has had some mild dysuria and urinary urgency.  States she has taken Azo without relief.  Patient seen her doctor several times for the same.  Patient's renal function was normal up until this week.  Patient's renal function last month was normal her doctor noted that her creatinine had increased from 2.3 to now 2.8 from a baseline of 0.8.  She went to the walk-in clinic this morning and they referred her to the emergency department for further evaluation and likely admission for acute renal insufficiency.  Patient states she has been experiencing some intermittent back/flank pain bilaterally.  States subjective fever intermittently over this last 6 months but none currently.  No vomiting no diarrhea.  Patient states she was recently started on Protonix for possible ulcer by her doctor and is planning to follow-up with GI medicine.  Patient states she does not use any NSAIDs, she does use occasional Tylenol for arthritis.  No history of kidney stones.  Physical Exam   Triage Vital Signs: ED Triage Vitals  Enc Vitals Group     BP 05/06/22 1141 129/70     Pulse Rate 05/06/22 1141 74     Resp 05/06/22 1141 16     Temp 05/06/22 1141 98.4 F (36.9 C)     Temp Source 05/06/22 1141 Oral     SpO2 05/06/22 1141 93 %     Weight --      Height 05/06/22 1146 '6\' 2"'$  (1.88 m)     Head Circumference --      Peak Flow --      Pain Score 05/06/22 1146 4     Pain Loc --      Pain Edu? --      Excl. in [place]? --     Most recent vital signs: Vitals:   05/06/22 1141  BP: 129/70  Pulse: 74  Resp: 16  Temp:  98.4 F (36.9 C)  SpO2: 93%    General: Awake, no distress.  CV:  Good peripheral perfusion.  Regular rate and rhythm  Resp:  Normal effort.  Equal breath sounds bilaterally.  Abd:  No distention.  Soft, mild suprapubic tenderness.  No rebound or guarding.  No CVA tenderness.   ED Results / Procedures / Treatments   RADIOLOGY  I have viewed and interpreted the CT images I do not see any obvious kidney stones. Radiology is read the CT is consistent with proximal colitis.   MEDICATIONS ORDERED IN ED: Medications - No data to display   IMPRESSION / MDM / [place] / ED COURSE  I reviewed the triage vital signs and the nursing notes.  Patient's presentation is most consistent with acute presentation with potential threat to life or bodily function.  Patient presents emergency department for worsening renal function dysuria/urinary frequency and flank pain.  I reviewed the patient's lab work from today at [place] clinic showing renal insufficiency creatinine is elevated greater than 2 from a baseline of 0.8.  No other significant abnormality on chemistry.  CBC shows no acute findings.  We will obtain a  urinalysis and send a urine culture.  We will obtain a CT renal scan and continue to closely monitor.  Patient agreeable to plan of care.  Patient's urine is discolored due to her use of Azo however does show greater than 50 white cells and white blood cell clumps likely indicative of urinary tract infection.  Urine culture has been sent we will start the patient on IV Rocephin.  CT has been read as likely proximal colitis.  Patient denies any significant diarrhea although states she has had loose stool since getting her gallbladder out 6 months ago.  No fever.  Given the patient has acute renal insufficiency with urinary tract infection and possible colitis we will continue IV antibiotics and admit to the hospitalist service for further work-up and treatment.  Patient agreeable  to plan.    FINAL CLINICAL IMPRESSION(S) / ED DIAGNOSES   Acute renal insufficiency Flank pain  Note:  This document was prepared using Dragon voice recognition software and may include unintentional dictation errors.   Harvest Dark, MD 05/06/22 1347

## 2022-05-06 NOTE — ED Notes (Signed)
Pt ambulated to toilet outside of room and back to room.

## 2022-05-06 NOTE — ED Triage Notes (Signed)
Patient to ER via [place]. Reports recurrent UTIs/ bladder infection/ bladder spasms. Patient reports that she had labs completed today and yesterday and her BUN and Creatinine were elevated. Patient was sent by [facility] for further evaluation.   Reports she has been taking sucralfate/ protonix for a possible gastric ulcer, states she is scheduled for an endoscopy.

## 2022-05-06 NOTE — ED Notes (Signed)
Pt is up ambulating around the room in no distress.

## 2022-05-06 NOTE — ED Notes (Signed)
Pt ambulated to bathroom across hall from room, then back to room.

## 2022-05-06 NOTE — Consult Note (Signed)
Kelsey Short MRN: [id number] DOB/AGE: [birth year] 56 y.o. Primary Care Physician:Miller, Christean Grief, MD Admit date: 05/06/2022 Chief Complaint:  Chief Complaint  Patient presents with   Abnormal Labs   HPI: Patient is a 56 year old Caucasian female with a past medical history of anxiety, GERD, arthritis, chronic diarrhea, history of cholecystectomy who came to the ER with chief complaint of abnormal labs.  History of present illness date back to past few weeks ago when patient started having increased fatigue and malaise.  Patient later went to her primary care today for a walk-in appointment with labs were drawn and patient was noted to have elevated creatinine patient was sent to the ER. Upon evaluation in the ER patient was found to have a creatinine of 2.8 and patient was admitted for further care.  Nephrology was consulted for worsening of kidney disease Patient was seen today on second floor Patient also complained of nausea and epigastric pain From past few months. Patient was earlier on NSAIDs , Patient gave a history of using Celebrex on a daily basis from past few months which were held. Patient did have coffee-ground emesis on July 6  Patient offers no complaint of frank hematuria Patient offers no complaint of history of nephrolithiasis Patient offers no complaint of small joint pains Patient offers no complaint of fever/cough/chills Patient does give a history of not feeling well from past 2-3 years. Patient given nonspecific complaints of malaise/Just not feeling herself     Past Medical History:  Diagnosis Date   Anxiety    Arthritis    Complication of anesthesia    GERD (gastroesophageal reflux disease)    PONV (postoperative nausea and vomiting)    NAUSEA        Family History  Problem Relation Age of Onset   Bladder Cancer Father    Prostate cancer Paternal Uncle    Prostate cancer Paternal Uncle    Prostate cancer Paternal Grandfather      Social History:  reports that she has never smoked. She has never used smokeless tobacco. She reports that she does not drink alcohol and does not use drugs.   Allergies:  Allergies  Allergen Reactions   Oxycodone Nausea Only    Medications Prior to Admission  Medication Sig Dispense Refill   acetaminophen (TYLENOL) 650 MG CR tablet Take 650 mg by mouth 2 (two) times daily as needed for pain.     celecoxib (CELEBREX) 200 MG capsule Take 1 capsule by mouth 2 (two) times daily.     cholecalciferol (VITAMIN D3) 25 MCG (1000 UNIT) tablet Take 1,000 Units by mouth daily.     escitalopram (LEXAPRO) 20 MG tablet Take 20 mg by mouth every evening.     estradiol (ESTRACE) 1 MG tablet Take 1 mg by mouth daily.     HYDROcodone-acetaminophen (NORCO/VICODIN) 5-325 MG tablet Take 1 tablet by mouth daily as needed.     Melatonin 10 MG TABS Take 10 mg by mouth at bedtime.     ondansetron (ZOFRAN) 4 MG tablet Take 1 tablet (4 mg total) by mouth every 8 (eight) hours as needed for nausea or vomiting. 10 tablet 0   oxybutynin (DITROPAN) 5 MG tablet Take 1 tablet (5 mg total) by mouth 3 (three) times daily. 30 tablet 0   pantoprazole (PROTONIX) 40 MG tablet Take 40 mg by mouth 2 (two) times daily.     progesterone (PROMETRIUM) 100 MG capsule Take 100 mg by mouth daily.     sucralfate (CARAFATE) 1  g tablet Take 1 g by mouth 4 (four) times daily.     vitamin B-12 (CYANOCOBALAMIN) 1000 MCG tablet Take 1,000 mcg by mouth daily.     colestipol (COLESTID) 1 g tablet Take 1 g by mouth 2 (two) times daily.     ondansetron (ZOFRAN ODT) 8 MG disintegrating tablet Take 1 tablet (8 mg total) by mouth every 8 (eight) hours as needed for nausea or vomiting. (Patient not taking: Reported on 05/06/2022) 15 tablet 0   phentermine 30 MG capsule Take 30 mg by mouth every morning. (Patient not taking: Reported on 05/06/2022)         IRW:ERXVQ from the symptoms mentioned above,there are no other symptoms referable to all  systems reviewed.   pantoprazole (PROTONIX) IV  40 mg Intravenous Q12H   sucralfate  2 g Oral TID      Physical Exam: Vital signs in last 24 hours: Temp:  [97.5 F (36.4 C)-98.4 F (36.9 C)] 97.5 F (36.4 C) (07/08 1600) Pulse Rate:  [66-75] 68 (07/08 1600) Resp:  [16-18] 18 (07/08 1600) BP: (122-133)/(66-90) 133/66 (07/08 1600) SpO2:  [93 %-96 %] 94 % (07/08 1600) Weight change:  Last BM Date : 05/06/22  Intake/Output from previous day: No intake/output data recorded. No intake/output data recorded.   Physical Exam: General- pt is awake,alert, oriented to time place and person Resp- No acute REsp distress, CTA B/L NO Rhonchi CVS- S1S2 regular ij rate and rhythm GIT- BS+, soft, NT, ND EXT- NO LE Edema, Cyanosis CNS- CN 2-12 grossly intact. Moving all 4 extremities Psych- normal mood and affect    Lab Results: CBC Recent Labs    05/06/22 1639  WBC 9.2  HGB 11.2*  HCT 34.9*  PLT 265    BMET Recent Labs    05/06/22 1639  NA 139  K 4.4  CL 103  CO2 24  GLUCOSE 88  BUN 38*  CREATININE 2.88*  CALCIUM 8.7*    MICRO No results found for this or any previous visit (from the past 240 hour(s)).    Lab Results  Component Value Date   CALCIUM 8.7 (L) 05/06/2022   CT scan done on May 06, 2022 Adrenals/Urinary Tract: Adrenal glands are unremarkable. Kidneys are normal, without renal calculi, focal lesion, or hydronephrosis. Bladder is mostly decompressed.  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 Patient has a history of incomplete bladder apnea We will ask for postvoid residual  Plan:   We will ask for autoimmune work-up We will ask  for proteinuria work-up Will ask for UPEP/SPEP We will ask for free kappa lambda chains Will ask for postvoid residual We will ask for intact PTH and phosphorus   I had extensive discussion with the patient and with the permission of patient with her brother who was present in the room as well about her kidney related issues and potential need for biopsy depending upon her clinical course/lab results    Kelsey Short s Theador Hawthorne 05/06/2022, 6:16 PM

## 2022-05-06 NOTE — H&P (Signed)
History and Physical    Kelsey Short NLG:[id number] DOB: [birth year] DOA: 05/06/2022  PCP: Rusty Aus, MD  Patient coming from: pcp office  I have personally briefly reviewed patient's old medical records in [place]  Chief Complaint: progressive renal failure/recurrent uti  HPI: Kelsey Short is a 56 y.o. female with medical history significant of anxiety, GERD, arthritis, who history of loose stools  x 1 year s/p Cholecystectomy with symptoms that are exacerbated by food.  Patient states  with these symptoms she was placed  imodium .  Patient states for the last few weeks she has had increase fatigue and generally note feeling her self. She has had nausea as well as epigastric pain. She states over the last 4days she has had decrease appetite.  She then became concerned when she had episode of  emesis on 7/6 that was described as coffee grounds. She then called pcp who d/c her celebrex and started her on omeprazole and carafate ordered routine labs and scheduled her for outpatient gi follow up.  Patient states on reviewing her labs in my chart she noted that her CR/BUN were elevated and due to continuing to feel poorly she presented to walk in clinic. Patient in walking clinic as noted to have even worsening renal function and was referred to ED for evaluation. Patient also endorses history of recurrent UTI and has been on antibiotics for the better part of the last 6 months w/o resolution of her infection. She notes she still experiences frequency, as well as bladder spasm. She denies any fever or flank pain. ON further ros she notes fatigue, generalized malaise. She however denies chest pain /sob/ rash/swollen joints.  She does history of frequent sinus infections and cold but non recently.  ED Course:  PCP labs this am  wbcL 10.1, hgb 11.4 at baseline, plt 245, pmn 75 Na: 138, cr 2.8 was 0.8 (6/13) ( 0.8-2.3-2.8), gfr 18bicarb 26 k 4.4 UA: +bacteria +rbc +wbc+  nitrate Protein >300 was 30 03/31/22 Ctabd IMPRESSION: 1. Circumferential wall thickening of the cecum and proximal ascending colon with minimal surrounding inflammatory change, consistent with colitis. Urine cultures Ecoli pan sensitive  Patient is admitted for further evaluation of progressive renal failure  As well as expedited evaluation for PUD Review of Systems: As per HPI otherwise 10 point review of systems negative.   Past Medical History:  Diagnosis Date   Anxiety    Arthritis    Complication of anesthesia    GERD (gastroesophageal reflux disease)    PONV (postoperative nausea and vomiting)    NAUSEA    Past Surgical History:  Procedure Laterality Date   CYSTOSCOPY WITH BIOPSY N/A 12/15/2019   Procedure: CYSTOSCOPY WITH BIOPSY;  Surgeon: Hollice Espy, MD;  Location: [facility] ORS;  Service: Urology;  Laterality: N/A;   KNEE ARTHROSCOPY WITH MEDIAL MENISECTOMY Right 05/30/2018   Procedure: KNEE ARTHROSCOPY WITH MEDIAL MENISECTOMY PARTIAL SYNOVECTOMY;  Surgeon: Hessie Knows, MD;  Location: [facility] OR[facility];  Service: Orthopedics;  Laterality: Right;   NASAL SINUS SURGERY     THYROID CYST EXCISION     UTERINE FIBROID SURGERY       reports that she has never smoked. She has never used smokeless tobacco. She reports that she does not drink alcohol and does not use drugs.  Allergies  Allergen Reactions   Oxycodone Nausea Only    Family History  Problem Relation Age of Onset   Bladder Cancer Father    Prostate cancer Paternal Uncle  Prostate cancer Paternal Uncle    Prostate cancer Paternal Grandfather     Prior to Admission medications   Medication Sig Start Date End Date Taking? Authorizing Provider  acetaminophen (TYLENOL) 650 MG CR tablet Take 650 mg by mouth 2 (two) times daily as needed for pain.    [provider]  celecoxib (CELEBREX) 200 MG capsule Take 1 capsule by mouth 2 (two) times daily. 04/13/22   [provider]  cholecalciferol (VITAMIN D3)  25 MCG (1000 UNIT) tablet Take 1,000 Units by mouth daily.    [provider]  colestipol (COLESTID) 1 g tablet Take 1 g by mouth 2 (two) times daily. 04/11/22   [provider]  escitalopram (LEXAPRO) 20 MG tablet Take 20 mg by mouth every evening.    [provider]  estradiol (ESTRACE) 1 MG tablet Take 1 mg by mouth daily. 04/05/22   [provider]  HYDROcodone-acetaminophen (NORCO/VICODIN) 5-325 MG tablet Take 1 tablet by mouth daily as needed. 03/23/22   [provider]  Melatonin 10 MG TABS Take 10 mg by mouth at bedtime.    [provider]  ondansetron (ZOFRAN ODT) 8 MG disintegrating tablet Take 1 tablet (8 mg total) by mouth every 8 (eight) hours as needed for nausea or vomiting. 02/08/21   Arta Silence, MD  ondansetron (ZOFRAN) 4 MG tablet Take 1 tablet (4 mg total) by mouth every 8 (eight) hours as needed for nausea or vomiting. 04/14/21   Herbert Pun, MD  ondansetron (ZOFRAN-ODT) 4 MG disintegrating tablet Take 4 mg by mouth every 8 (eight) hours as needed. 05/05/22   [provider]  oxybutynin (DITROPAN) 5 MG tablet Take 1 tablet (5 mg total) by mouth 3 (three) times daily. 04/14/21   Herbert Pun, MD  pantoprazole (PROTONIX) 40 MG tablet Take 40 mg by mouth 2 (two) times daily. 05/05/22   [provider]  phentermine 30 MG capsule Take 30 mg by mouth every morning. 02/24/22   [provider]  progesterone (PROMETRIUM) 100 MG capsule Take 100 mg by mouth daily. 04/04/22   [provider]  sucralfate (CARAFATE) 1 g tablet Take 1 g by mouth 4 (four) times daily. 05/05/22   [provider]  vitamin B-12 (CYANOCOBALAMIN) 1000 MCG tablet Take 1,000 mcg by mouth daily.    [provider]    Physical Exam: Vitals:   05/06/22 1141 05/06/22 1146 05/06/22 1200  BP: 129/70  124/90  Pulse: 74  66  Resp: 16  16  Temp: 98.4 F (36.9 C)    TempSrc: Oral    SpO2: 93%  93%   Height:  '6\' 2"'$  (1.88 m)      Vitals:   05/06/22 1141 05/06/22 1146 05/06/22 1200  BP: 129/70  124/90  Pulse: 74  66  Resp: 16  16  Temp: 98.4 F (36.9 C)    TempSrc: Oral    SpO2: 93%  93%  Height:  '6\' 2"'$  (1.88 m)   Constitutional: NAD, calm, comfortable Eyes: PERRL, lids and conjunctivae normal ENMT: Mucous membranes are moist. Posterior pharynx clear of any exudate or lesions.Normal dentition.  Neck: normal, supple, no masses, no thyromegaly Respiratory: clear to auscultation bilaterally, no wheezing, no crackles. Normal respiratory effort. No accessory muscle use.  Cardiovascular: Regular rate and rhythm, no murmurs / rubs / gallops. No extremity edema. 2+ pedal pulses.  Abdomen: +epigastric tenderness, +suprapubic tenderness no masses palpated. No hepatosplenomegaly. Bowel sounds positive.  Musculoskeletal: no clubbing / cyanosis. No  joint deformity upper and lower extremities. Good ROM, no contractures. Normal muscle tone.  Skin: no rashes, lesions, ulcers. No induration Neurologic: CN 2-12 grossly intact. Sensation intact,Strength 5/5 in all 4.  Psychiatric: Normal judgment and insight. Alert and oriented x 3. Normal mood.    Labs on Admission: I have personally reviewed following labs and imaging studies  CBC: No results for input(s): "WBC", "NEUTROABS", "HGB", "HCT", "MCV", "PLT" in the last 168 hours. Basic Metabolic Panel: No results for input(s): "NA", "K", "CL", "CO2", "GLUCOSE", "BUN", "CREATININE", "CALCIUM", "MG", "PHOS" in the last 168 hours. GFR: CrCl cannot be calculated (Patient's most recent lab result is older than the maximum 21 days allowed.). Liver Function Tests: No results for input(s): "AST", "ALT", "ALKPHOS", "BILITOT", "PROT", "ALBUMIN" in the last 168 hours. No results for input(s): "LIPASE", "AMYLASE" in the last 168 hours. No results for input(s): "AMMONIA" in the last 168 hours. Coagulation Profile: No results for input(s): "INR", "PROTIME"  in the last 168 hours. Cardiac Enzymes: No results for input(s): "CKTOTAL", "CKMB", "CKMBINDEX", "TROPONINI" in the last 168 hours. BNP (last 3 results) No results for input(s): "PROBNP" in the last 8760 hours. HbA1C: No results for input(s): "HGBA1C" in the last 72 hours. CBG: No results for input(s): "GLUCAP" in the last 168 hours. Lipid Profile: No results for input(s): "CHOL", "HDL", "LDLCALC", "TRIG", "CHOLHDL", "LDLDIRECT" in the last 72 hours. Thyroid Function Tests: No results for input(s): "TSH", "T4TOTAL", "FREET4", "T3FREE", "THYROIDAB" in the last 72 hours. Anemia Panel: No results for input(s): "VITAMINB12", "FOLATE", "FERRITIN", "TIBC", "IRON", "RETICCTPCT" in the last 72 hours. Urine analysis:    Component Value Date/Time   COLORURINE ORANGE (A) 05/06/2022 1208   APPEARANCEUR HAZY (A) 05/06/2022 1208   APPEARANCEUR Clear 12/10/2019 1014   LABSPEC 1.011 05/06/2022 1208   PHURINE  05/06/2022 1208    TEST NOT REPORTED DUE TO COLOR INTERFERENCE OF URINE PIGMENT   GLUCOSEU (A) 05/06/2022 1208    TEST NOT REPORTED DUE TO COLOR INTERFERENCE OF URINE PIGMENT   HGBUR (A) 05/06/2022 1208    TEST NOT REPORTED DUE TO COLOR INTERFERENCE OF URINE PIGMENT   BILIRUBINUR (A) 05/06/2022 1208    TEST NOT REPORTED DUE TO COLOR INTERFERENCE OF URINE PIGMENT   BILIRUBINUR Negative 12/10/2019 1014   KETONESUR (A) 05/06/2022 1208    TEST NOT REPORTED DUE TO COLOR INTERFERENCE OF URINE PIGMENT   PROTEINUR (A) 05/06/2022 1208    TEST NOT REPORTED DUE TO COLOR INTERFERENCE OF URINE PIGMENT   NITRITE (A) 05/06/2022 1208    TEST NOT REPORTED DUE TO COLOR INTERFERENCE OF URINE PIGMENT   LEUKOCYTESUR (A) 05/06/2022 1208    TEST NOT REPORTED DUE TO COLOR INTERFERENCE OF URINE PIGMENT    Radiological Exams on Admission: CT Renal Stone Study  Result Date: 05/06/2022 CLINICAL DATA:  Recurrent UTIs.  Acute kidney injury. EXAM: CT ABDOMEN AND PELVIS WITHOUT CONTRAST TECHNIQUE: Multidetector CT  imaging of the abdomen and pelvis was performed following the standard protocol without IV contrast. RADIATION DOSE REDUCTION: This exam was performed according to the departmental dose-optimization program which includes automated exposure control, adjustment of the mA and/or kV according to patient size and/or use of iterative reconstruction technique. COMPARISON:  CT abdomen pelvis dated November 18, 2019. FINDINGS: Lower chest: No acute abnormality. Hepatobiliary: No focal liver abnormality is seen. Status post cholecystectomy. No biliary dilatation. Pancreas: Unremarkable. No pancreatic ductal dilatation or surrounding inflammatory changes. Spleen: Normal in size without focal abnormality. Adrenals/Urinary Tract: Adrenal glands are unremarkable. Kidneys  are normal, without renal calculi, focal lesion, or hydronephrosis. Bladder is mostly decompressed. Stomach/Bowel: The stomach is within normal limits. Circumferential wall thickening of the cecum and proximal ascending colon with minimal surrounding inflammatory change. No obstruction. Normal appendix. Vascular/Lymphatic: No significant vascular findings are present. No enlarged abdominal or pelvic lymph nodes. Reproductive: IUD appropriately positioned within the uterus. No adnexal mass. Other: No abdominal wall hernia or abnormality. No abdominopelvic ascites. No pneumoperitoneum. Musculoskeletal: No acute or significant osseous findings. IMPRESSION: 1. Circumferential wall thickening of the cecum and proximal ascending colon with minimal surrounding inflammatory change, consistent with colitis. Electronically Signed   By: Titus Dubin M.D.   On: 05/06/2022 13:20    EKG: N/a  Assessment/Plan Acute renal failure -progressive over the last 2 weeks  -unclear cause  -admit to tele -renal consult ,appreciate recs (Dr Theador Hawthorne) -hold nephrotoxic medications  - gently iv hydration  -ct scan noted normal kidney, no obstruction or hydronephrosis  - UA  notes + UTI/ + protein   UTI -start CTX, f/u on urine culture   Upper Gi bleed/PUD -start on clears  -ppi bid , carafate  -h/h currently stable will cycle - Gi dr Virgina Jock consulted will see in am    Chronic diarrhea  -gi panel ordered  -fecal calprotectin  -Circumferential wall thickening of the cecum and proximal ascending colon noted on CT scan  -tx with imodium and colestipol     DVT prophylaxis: scd Code Status: full Family Communication: none at beside Disposition Plan: patient  expected to be admitted greater than 2 midnights  Consults called:  renal , gi see above Admission status: med tele   Clance Boll MD [facility]   If 7PM-7AM, please contact night-coverage [email or web address] Password [email or web address]  05/06/2022, 2:11 PM

## 2022-05-07 DIAGNOSIS — N179 Acute kidney failure, unspecified: Secondary | ICD-10-CM | POA: Diagnosis not present

## 2022-05-07 DIAGNOSIS — I10 Essential (primary) hypertension: Secondary | ICD-10-CM | POA: Diagnosis present

## 2022-05-07 DIAGNOSIS — F39 Unspecified mood [affective] disorder: Secondary | ICD-10-CM | POA: Diagnosis present

## 2022-05-07 DIAGNOSIS — K529 Noninfective gastroenteritis and colitis, unspecified: Secondary | ICD-10-CM | POA: Diagnosis present

## 2022-05-07 DIAGNOSIS — D649 Anemia, unspecified: Secondary | ICD-10-CM | POA: Diagnosis present

## 2022-05-07 DIAGNOSIS — K920 Hematemesis: Secondary | ICD-10-CM | POA: Diagnosis present

## 2022-05-07 LAB — CBC
HCT: 31.4 % — ABNORMAL LOW (ref 36.0–46.0)
Hemoglobin: 10.1 g/dL — ABNORMAL LOW (ref 12.0–15.0)
MCH: 30.7 pg (ref 26.0–34.0)
MCHC: 32.2 g/dL (ref 30.0–36.0)
MCV: 95.4 fL (ref 80.0–100.0)
Platelets: 253 10*3/uL (ref 150–400)
RBC: 3.29 MIL/uL — ABNORMAL LOW (ref 3.87–5.11)
RDW: 12.1 % (ref 11.5–15.5)
WBC: 6.6 10*3/uL (ref 4.0–10.5)
nRBC: 0 % (ref 0.0–0.2)

## 2022-05-07 LAB — PHOSPHORUS: Phosphorus: 4.2 mg/dL (ref 2.5–4.6)

## 2022-05-07 LAB — CREATININE, URINE, 24 HOUR
Collection Interval-UCRE24: 24 hours
Creatinine, 24H Ur: 1470 mg/d (ref 600–1800)
Creatinine, Urine: 70 mg/dL
Urine Total Volume-UCRE24: 2100 mL

## 2022-05-07 LAB — COMPREHENSIVE METABOLIC PANEL
ALT: 15 U/L (ref 0–44)
AST: 18 U/L (ref 15–41)
Albumin: 3.7 g/dL (ref 3.5–5.0)
Alkaline Phosphatase: 56 U/L (ref 38–126)
Anion gap: 8 (ref 5–15)
BUN: 35 mg/dL — ABNORMAL HIGH (ref 6–20)
CO2: 25 mmol/L (ref 22–32)
Calcium: 8.2 mg/dL — ABNORMAL LOW (ref 8.9–10.3)
Chloride: 106 mmol/L (ref 98–111)
Creatinine, Ser: 2.6 mg/dL — ABNORMAL HIGH (ref 0.44–1.00)
GFR, Estimated: 21 mL/min — ABNORMAL LOW (ref >60)
Glucose, Bld: 90 mg/dL (ref 70–99)
Potassium: 3.9 mmol/L (ref 3.5–5.1)
Sodium: 139 mmol/L (ref 135–145)
Total Bilirubin: 1.3 mg/dL — ABNORMAL HIGH (ref 0.3–1.2)
Total Protein: 6.1 g/dL — ABNORMAL LOW (ref 6.5–8.1)

## 2022-05-07 LAB — IRON AND TIBC
Iron: 76 ug/dL (ref 28–170)
Saturation Ratios: 29 % (ref 10.4–31.8)
TIBC: 263 ug/dL (ref 250–450)
UIBC: 187 ug/dL

## 2022-05-07 LAB — FERRITIN: Ferritin: 264 ng/mL (ref 11–307)

## 2022-05-07 LAB — VITAMIN B12: Vitamin B-12: 4517 pg/mL — ABNORMAL HIGH (ref 180–914)

## 2022-05-07 LAB — FOLATE: Folate: 7 ng/mL (ref >5.9)

## 2022-05-07 LAB — C-REACTIVE PROTEIN: CRP: 1.6 mg/dL — ABNORMAL HIGH (ref <1.0)

## 2022-05-07 LAB — HIV ANTIBODY (ROUTINE TESTING W REFLEX): HIV Screen 4th Generation wRfx: NONREACTIVE

## 2022-05-07 LAB — PROTIME-INR
INR: 1.1 (ref 0.8–1.2)
Prothrombin Time: 14.5 seconds (ref 11.4–15.2)

## 2022-05-07 LAB — PROTEIN, URINE, 24 HOUR
Collection Interval-UPROT: 24 hours
Protein, 24H Urine: 168 mg/d — ABNORMAL HIGH (ref 50–100)
Protein, Urine: 8 mg/dL
Urine Total Volume-UPROT: 2100 mL

## 2022-05-07 LAB — GLUCOSE, CAPILLARY: Glucose-Capillary: 93 mg/dL (ref 70–99)

## 2022-05-07 MED ORDER — MELATONIN 5 MG PO TABS
10.0000 mg | ORAL_TABLET | Freq: Every day | ORAL | Status: DC
  Administered 2022-05-07: 10 mg via ORAL
  Filled 2022-05-07: qty 2

## 2022-05-07 MED ORDER — ESCITALOPRAM OXALATE 10 MG PO TABS
20.0000 mg | ORAL_TABLET | Freq: Every evening | ORAL | Status: DC
  Administered 2022-05-07: 20 mg via ORAL
  Filled 2022-05-07: qty 2

## 2022-05-07 MED ORDER — HYDROCODONE-ACETAMINOPHEN 5-325 MG PO TABS: 1.0000 | ORAL_TABLET | Freq: Every day | ORAL | Status: DC | PRN

## 2022-05-07 MED ORDER — LACTATED RINGERS IV SOLN: INTRAVENOUS | Status: DC

## 2022-05-07 MED ORDER — SUCRALFATE 1 G PO TABS
1.0000 g | ORAL_TABLET | Freq: Four times a day (QID) | ORAL | Status: DC
Start: 2022-05-07 — End: 2022-05-08
  Administered 2022-05-07 – 2022-05-08 (×3): 1 g via ORAL
  Filled 2022-05-07 (×5): qty 1

## 2022-05-07 MED ORDER — COLESTIPOL HCL 1 G PO TABS
1.0000 g | ORAL_TABLET | Freq: Two times a day (BID) | ORAL | Status: DC
  Administered 2022-05-07 (×2): 1 g via ORAL
  Filled 2022-05-07 (×3): qty 1

## 2022-05-07 MED ORDER — VITAMIN B-12 1000 MCG PO TABS
1000.0000 ug | ORAL_TABLET | Freq: Every day | ORAL | Status: DC
  Administered 2022-05-07: 1000 ug via ORAL
  Filled 2022-05-07 (×2): qty 1

## 2022-05-07 MED ORDER — PANTOPRAZOLE SODIUM 40 MG PO TBEC: 40.0000 mg | DELAYED_RELEASE_TABLET | Freq: Two times a day (BID) | ORAL | Status: DC

## 2022-05-07 MED ORDER — OXYBUTYNIN CHLORIDE 5 MG PO TABS
5.0000 mg | ORAL_TABLET | Freq: Three times a day (TID) | ORAL | Status: DC
Start: 2022-05-07 — End: 2022-05-07

## 2022-05-07 MED ORDER — ACETAMINOPHEN 325 MG PO TABS
650.0000 mg | ORAL_TABLET | Freq: Four times a day (QID) | ORAL | Status: DC | PRN
  Administered 2022-05-07 – 2022-05-08 (×2): 650 mg via ORAL
  Filled 2022-05-07 (×2): qty 2

## 2022-05-07 NOTE — Consult Note (Signed)
Inpatient Consultation   Patient ID: Kelsey Short is a 56 y.o. female.  Requesting Provider: Dr Myles Rosenthal  Date of Admission: 05/06/2022  Date of Consult: 05/07/22   Reason for Consultation: ugib   Patient's Chief Complaint:   Chief Complaint  Patient presents with   Abnormal Labs    56 year old Caucasian female with history of anxiety, GERD who presents to the hospital after not feeling well noted to have abnormal renal function testing.  Gastroenterology is consulted for potential upper GI bleed.  Patient has been on daily Celebrex which has since been stopped.  She noted 2 episodes on Thursday and one episode of Friday dark vomit.  She denies hematemesis.  Not preceded by abdominal pain.  No fevers or chills.  Bowel movements have been brown and mostly formed with Imodium use.  She denies any melena or hematochezia.  Hemoglobin has remained stable for the last month around 11.8.  Last year her hemoglobin was 14.  Hemoglobin now 10.1 after IV fluids and blood draws.  After her dark emesis she was started on PPI and Carafate by her primary care.  She started to feel better after taking these.  Has had several days of decreased appetite.  She notes since her cholecystectomy she has had loose bowel movements but this is alleviated by taking Imodium twice daily which provides more formed.  She was recently started on colestipol but has not yet started the medication.  She endorses cramping lower abdominal discomfort prior to bowel movement which is relieved with defecation.  She has no nocturnal awakenings or tenesmus.  Lower abdominal discomfort can occasionally radiate to the epigastric region.  Again this is relieved by having a bowel movement.  She is no longer taking duloxetine.  No previous renal disease but creatinine noted at presentation to be 2.88.  CT performed with some circumferential wall thickening in the colon with minimal inflammation and no stranding  She  does have some urinary symptoms treated by primary team  Daily celebrex use Denies Anti-plt agents, and anticoagulants Denies family history of gastrointestinal disease and malignancy Previous Endoscopies: none      Past Medical History:  Diagnosis Date   Anxiety    Arthritis    Complication of anesthesia    GERD (gastroesophageal reflux disease)    PONV (postoperative nausea and vomiting)    NAUSEA    Past Surgical History:  Procedure Laterality Date   CYSTOSCOPY WITH BIOPSY N/A 12/15/2019   Procedure: CYSTOSCOPY WITH BIOPSY;  Surgeon: Hollice Espy, MD;  Location: [facility];  Service: Urology;  Laterality: N/A;   KNEE ARTHROSCOPY WITH MEDIAL MENISECTOMY Right 05/30/2018   Procedure: KNEE ARTHROSCOPY WITH MEDIAL MENISECTOMY PARTIAL SYNOVECTOMY;  Surgeon: Hessie Knows, MD;  Location: [facility];  Service: Orthopedics;  Laterality: Right;   NASAL SINUS SURGERY     THYROID CYST EXCISION     UTERINE FIBROID SURGERY      Allergies  Allergen Reactions   Oxycodone Nausea Only    Family History  Problem Relation Age of Onset   Bladder Cancer Father    Prostate cancer Paternal Uncle    Prostate cancer Paternal Uncle    Prostate cancer Paternal Grandfather     Social History   Tobacco Use   Smoking status: Never   Smokeless tobacco: Never  Vaping Use   Vaping Use: Never used  Substance Use Topics   Alcohol use: Never   Drug use: Never     Pertinent GI related  history and allergies were reviewed with the patient  Review of Systems  Constitutional:  Positive for appetite change. Negative for activity change, chills, diaphoresis, fatigue, fever and unexpected weight change.  HENT:  Negative for trouble swallowing and voice change.   Respiratory:  Negative for shortness of breath and wheezing.   Cardiovascular:  Negative for chest pain, palpitations and leg swelling.  Gastrointestinal:  Positive for diarrhea, nausea and vomiting. Negative for abdominal distention,  abdominal pain, anal bleeding, blood in stool, constipation and rectal pain.  Musculoskeletal:  Negative for myalgias.  Skin:  Negative for color change and pallor.  Neurological:  Negative for dizziness, syncope and weakness.  Psychiatric/Behavioral:  Negative for agitation and confusion.   All other systems reviewed and are negative.    Medications Home Medications No current facility-administered medications on file prior to encounter.   Current Outpatient Medications on File Prior to Encounter  Medication Sig Dispense Refill   acetaminophen (TYLENOL) 650 MG CR tablet Take 650 mg by mouth 2 (two) times daily as needed for pain.     celecoxib (CELEBREX) 200 MG capsule Take 1 capsule by mouth 2 (two) times daily.     cholecalciferol (VITAMIN D3) 25 MCG (1000 UNIT) tablet Take 1,000 Units by mouth daily.     escitalopram (LEXAPRO) 20 MG tablet Take 20 mg by mouth every evening.     estradiol (ESTRACE) 1 MG tablet Take 1 mg by mouth daily.     HYDROcodone-acetaminophen (NORCO/VICODIN) 5-325 MG tablet Take 1 tablet by mouth daily as needed.     Melatonin 10 MG TABS Take 10 mg by mouth at bedtime.     ondansetron (ZOFRAN) 4 MG tablet Take 1 tablet (4 mg total) by mouth every 8 (eight) hours as needed for nausea or vomiting. 10 tablet 0   oxybutynin (DITROPAN) 5 MG tablet Take 1 tablet (5 mg total) by mouth 3 (three) times daily. 30 tablet 0   pantoprazole (PROTONIX) 40 MG tablet Take 40 mg by mouth 2 (two) times daily.     progesterone (PROMETRIUM) 100 MG capsule Take 100 mg by mouth daily.     sucralfate (CARAFATE) 1 g tablet Take 1 g by mouth 4 (four) times daily.     vitamin B-12 (CYANOCOBALAMIN) 1000 MCG tablet Take 1,000 mcg by mouth daily.     colestipol (COLESTID) 1 g tablet Take 1 g by mouth 2 (two) times daily.     ondansetron (ZOFRAN ODT) 8 MG disintegrating tablet Take 1 tablet (8 mg total) by mouth every 8 (eight) hours as needed for nausea or vomiting. (Patient not taking:  Reported on 05/06/2022) 15 tablet 0   phentermine 30 MG capsule Take 30 mg by mouth every morning. (Patient not taking: Reported on 05/06/2022)     Pertinent GI related medications were reviewed with the patient  Inpatient Medications  Current Facility-Administered Medications:    0.9 %  sodium chloride infusion, , Intravenous, Continuous, Clance Boll, MD, Stopped at 05/07/22 0722   albuterol (PROVENTIL) (2.5 MG/3ML) 0.083% nebulizer solution 2.5 mg, 2.5 mg, Nebulization, Q2H PRN, Myles Rosenthal A, MD   cefTRIAXone (ROCEPHIN) 1 g in sodium chloride 0.9 % 100 mL IVPB, 1 g, Intravenous, Q24H, Thomas, Sara-Maiz A, MD   metroNIDAZOLE (FLAGYL) IVPB 500 mg, 500 mg, Intravenous, Q12H, Myles Rosenthal A, MD, Last Rate: 100 mL/hr at 05/07/22 0447, 500 mg at 05/07/22 0447   ondansetron (ZOFRAN) tablet 4 mg, 4 mg, Oral, Q6H PRN **OR** ondansetron (ZOFRAN) injection 4 mg,  4 mg, Intravenous, Q6H PRN, Myles Rosenthal A, MD, 4 mg at 05/06/22 2135   oxybutynin (DITROPAN) tablet 5 mg, 5 mg, Oral, Q8H PRN, Myles Rosenthal A, MD   pantoprazole (PROTONIX) injection 40 mg, 40 mg, Intravenous, Q12H, Myles Rosenthal A, MD, 40 mg at 05/06/22 2136   traMADol (ULTRAM) tablet 50 mg, 50 mg, Oral, Q8H PRN, Myles Rosenthal A, MD, 50 mg at 05/07/22 0107  sodium chloride Stopped (05/07/22 0722)   cefTRIAXone (ROCEPHIN)  IV     metronidazole 500 mg (05/07/22 0447)    albuterol, ondansetron **OR** ondansetron (ZOFRAN) IV, oxybutynin, traMADol   Objective   Vitals:   05/06/22 1600 05/06/22 1953 05/07/22 0402 05/07/22 0759  BP: 133/66 130/74 108/60 122/68  Pulse: 68 68 83 71  Resp: '18 18 18 18  '$ Temp: (!) 97.5 F (36.4 C) 98.3 F (36.8 C) 98.6 F (37 C) 98.2 F (36.8 C)  TempSrc: Oral Oral Oral Oral  SpO2: 94% 97% 94% 94%  Weight:   81 kg   Height:         Physical Exam Vitals and nursing note reviewed.  Constitutional:      General: She is not in acute distress.    Appearance: Normal  appearance. She is not ill-appearing, toxic-appearing or diaphoretic.  HENT:     Head: Normocephalic and atraumatic.     Nose: Nose normal.     Mouth/Throat:     Mouth: Mucous membranes are moist.     Pharynx: Oropharynx is clear.  Eyes:     General: No scleral icterus.    Extraocular Movements: Extraocular movements intact.  Cardiovascular:     Rate and Rhythm: Normal rate and regular rhythm.     Heart sounds: Normal heart sounds. No murmur heard.    No friction rub. No gallop.  Pulmonary:     Effort: Pulmonary effort is normal. No respiratory distress.     Breath sounds: Normal breath sounds. No wheezing, rhonchi or rales.  Abdominal:     General: Abdomen is flat. Bowel sounds are normal. There is no distension.     Palpations: Abdomen is soft.     Tenderness: There is no abdominal tenderness. There is no guarding or rebound.  Musculoskeletal:     Cervical back: Neck supple.     Right lower leg: No edema.     Left lower leg: No edema.  Skin:    General: Skin is warm and dry.     Coloration: Skin is not jaundiced or pale.  Neurological:     General: No focal deficit present.     Mental Status: She is alert and oriented to person, place, and time. Mental status is at baseline.  Psychiatric:        Mood and Affect: Mood normal.        Behavior: Behavior normal.        Thought Content: Thought content normal.        Judgment: Judgment normal.     Laboratory Data Recent Labs  Lab 05/06/22 1639 05/07/22 0613  WBC 9.2 6.6  HGB 11.2* 10.1*  HCT 34.9* 31.4*  PLT 265 253   Recent Labs  Lab 05/06/22 1639 05/07/22 0613  NA 139 139  K 4.4 3.9  CL 103 106  CO2 24 25  BUN 38* 35*  CALCIUM 8.7* 8.2*  PROT  --  6.1*  BILITOT  --  1.3*  ALKPHOS  --  56  ALT  --  15  AST  --  18  GLUCOSE 88 90   Recent Labs  Lab 05/07/22 0613  INR 1.1    No results for input(s): "LIPASE" in the last 72 hours.      Imaging Studies: CT Renal Stone Study  Result Date:  05/06/2022 CLINICAL DATA:  Recurrent UTIs.  Acute kidney injury. EXAM: CT ABDOMEN AND PELVIS WITHOUT CONTRAST TECHNIQUE: Multidetector CT imaging of the abdomen and pelvis was performed following the standard protocol without IV contrast. RADIATION DOSE REDUCTION: This exam was performed according to the departmental dose-optimization program which includes automated exposure control, adjustment of the mA and/or kV according to patient size and/or use of iterative reconstruction technique. COMPARISON:  CT abdomen pelvis dated November 18, 2019. FINDINGS: Lower chest: No acute abnormality. Hepatobiliary: No focal liver abnormality is seen. Status post cholecystectomy. No biliary dilatation. Pancreas: Unremarkable. No pancreatic ductal dilatation or surrounding inflammatory changes. Spleen: Normal in size without focal abnormality. Adrenals/Urinary Tract: Adrenal glands are unremarkable. Kidneys are normal, without renal calculi, focal lesion, or hydronephrosis. Bladder is mostly decompressed. Stomach/Bowel: The stomach is within normal limits. Circumferential wall thickening of the cecum and proximal ascending colon with minimal surrounding inflammatory change. No obstruction. Normal appendix. Vascular/Lymphatic: No significant vascular findings are present. No enlarged abdominal or pelvic lymph nodes. Reproductive: IUD appropriately positioned within the uterus. No adnexal mass. Other: No abdominal wall hernia or abnormality. No abdominopelvic ascites. No pneumoperitoneum. Musculoskeletal: No acute or significant osseous findings. IMPRESSION: 1. Circumferential wall thickening of the cecum and proximal ascending colon with minimal surrounding inflammatory change, consistent with colitis. Electronically Signed   By: Titus Dubin M.D.   On: 05/06/2022 13:20    Assessment:   # Reported coffee ground emesis - Pt noted x2 on Thursday and once on Friday - no further episodes - BM have been brown  # Normocytic  anemia - hgb has been stable for last month and repeated draws are the same. After IVF and blood draws, slightly decreased to 10.1; hgb 14 last year - no current signs of GIB - chronic nsaid use- celebrex - only recently started on ppi - vss; bun/cr ratio ~13  # Circumferential wall thickening of right colon on CT w/o inflammation - diarrheal sx controlled by imodium - no abdominal pain or fever - pt w/o nocturnal awakening - BM preceded by abdominal cramping and improved by defecation - infectious and inflammatory markers negative - suspect element of bile salt diarrhea from cholecystectomy  # AKI - nsaid use, lack of po intake - being followed by nephrology in hospital   Plan:  Esophagogastroduodenoscopy planned for tomorrow pending patient stability and endoscopy suite availability NPO at midnight Full liquids now Labs in am- bmp, cbc Protonix 40 mg iv q12 h Hold dvt ppx Monitor H&H.  Transfusion and resuscitation as per primary team Avoid frequent lab draws to prevent lab induced anemia Supportive care and antiemetics as per primary team Abx not required for GI conditions at this time Maintain two sites IV access Avoid nsaids Monitor for GIB. Recommended to pt to initiate colestipol and keep imodium as prn for increased diarrheal sx Discontinue carafate as has no impact on ulcer healing if present Patient's preference is to perform colonoscopy as an outpatient.  Please do not  perform gastric or fecal occult blood testing. Gastric and Stool occult blood test has no role in evaluation of anemia. Stool occult blood testing is for colon cancer screening and is inappropriate to be used for evaluation of anemia/gastrointestinal bleeding, as a negative  or positive test would not change evaluation.  Esophagogastroduodenoscopy with possible biopsy, control of bleeding, polypectomy, and interventions as necessary has been discussed with the patient/patient representative. Informed  consent was obtained from the patient/patient representative after explaining the indication, nature, and risks of the procedure including but not limited to death, bleeding, perforation, missed neoplasm/lesions, cardiorespiratory compromise, and reaction to medications. Opportunity for questions was given and appropriate answers were provided. Patient/patient representative has verbalized understanding is amenable to undergoing the procedure.  I personally performed the service.  Management of other medical comorbidities as per primary team  Thank you for allowing [place] to participate in this patient's care. Please don't hesitate to call if any questions or concerns arise.   Annamaria Helling, DO [facility]  Portions of the record may have been created with voice recognition software. Occasional wrong-word or 'sound-a-like' substitutions may have occurred due to the inherent limitations of voice recognition software.  Read the chart carefully and recognize, using context, where substitutions may have occurred.

## 2022-05-07 NOTE — Hospital Course (Signed)
History of Kelsey Short, GERD, arthritis presents hospital with complaints of abdominal pain with nausea and generalized fatigue ongoing since last 4 days.  Reportedly she had loose stool for last 1 year since her cholecystectomy. Started having fatigue and generalized weakness for last few weeks and for last 4 days started having decreased appetite as well as nausea and had an episode of vomiting. There was some complaint of coffee-ground emesis. Found to have acute kidney injury, mild anemia. Currently GI is consulted nephrology is consulted

## 2022-05-07 NOTE — Progress Notes (Addendum)
Progress Note Patient: Kelsey Short GEZ:[id number] DOB: 01/18/1966 DOA: 05/06/2022  DOS: the patient was seen and examined on 05/07/2022  Brief hospital course: History of Zaidi, GERD, arthritis presents hospital with complaints of abdominal pain with nausea and generalized fatigue ongoing since last 4 days.  Reportedly she had loose stool for last 1 year since her cholecystectomy. Started having fatigue and generalized weakness for last few weeks and for last 4 days started having decreased appetite as well as nausea and had an episode of vomiting. There was some complaint of coffee-ground emesis. Found to have acute kidney injury, mild anemia. Currently GI is consulted nephrology is consulted Assessment and Plan: AKI (acute kidney injury) CKD ruled out. Presents with complaints of nausea and vomiting as well as generalized feeling of being unwell ongoing for last few weeks. At baseline serum creatinine 0.8.  On admission serum creatinine 2.88.  BUN was 38. CT abdomen negative for any acute intra-abdominal pathology with regards to kidneys or hydronephrosis. UA was not helpful due to interference with urine color.  Does not appear to have significant proteinuria. Currently gradually improving with IV hydration. Nephrology consulted. Etiology most likely is a combination of ATN, dehydration causing prerenal etiology, chronic use of celecoxib as well as HCTZ. For now continue IV hydration and monitor renal function. Changing from NS to LR.  Right sided colitis Seen incidentally on the CT scan. Patient's pain symptoms are primarily related in the epigastric region with nausea and vomiting. Currently receiving IV ceftriaxone and Flagyl. Will monitor.  Coffee ground emesis GI consulted. Scheduled for upper GI endoscopy tomorrow. N.p.o. after midnight.  Normocytic anemia Baseline hemoglobin around 11. Currently hemoglobin is 11.2 on admission.  The drop to 10.1 is mostly dilutional  in nature. At present monitor. Using SCDs for DVT prophylaxis instead of pharmaceuticals.  Essential hypertension On HCTZ. Likely contributing to patient's current presentation. Holding.  B12 deficiency Continue current care.  Osteoarthritis of knee Overactive detrusor Mood disorder (HCC)   Subjective: Reports mild crampy abdominal pain on the epigastric area.  No nausea no vomiting.  No fever no chills.  Tolerating clear liquid diet.  Had a bowel movement without any blood.  No burning urination.  Physical Exam: Vitals:   05/06/22 1600 05/06/22 1953 05/07/22 0402 05/07/22 0759  BP: 133/66 130/74 108/60 122/68  Pulse: 68 68 83 71  Resp: '18 18 18 18  '$ Temp: (!) 97.5 F (36.4 C) 98.3 F (36.8 C) 98.6 F (37 C) 98.2 F (36.8 C)  TempSrc: Oral Oral Oral Oral  SpO2: 94% 97% 94% 94%  Weight:   81 kg   Height:       General: Appear in mild distress; no visible Abnormal Neck Mass Or lumps, Conjunctiva normal Cardiovascular: S1 and S2 Present, no Murmur, Respiratory: good respiratory effort, Bilateral Air entry present and CTA, no Crackles, no wheezes Abdomen: Bowel Sound present, Non tender  Extremities: no Pedal edema Neurology: alert and oriented to time, place, and person  Gait not checked due to patient safety concerns   Data Reviewed: I have Reviewed nursing notes, Vitals, and Lab results since pt's last encounter. Pertinent lab results CBC and BMP I have ordered test including CBC and BMP I have reviewed the last note from GI and nephrology,    Family Communication: Family at bedside  Disposition: Status is: Inpatient Remains inpatient appropriate because: Need for IV hydration as well as IV antibiotics and EGD scheduled for tomorrow  Author: Berle Mull, MD 05/07/2022 2:18 PM  Please look on [email or web address] to find out who is on call.

## 2022-05-07 NOTE — Progress Notes (Addendum)
Kelsey Short  MRN: [id number]  DOB/AGE: June 23, 1966 56 y.o.  Primary Care Physician:Miller, Christean Grief, MD  Admit date: 05/06/2022  Chief Complaint:  Chief Complaint  Patient presents with   Abnormal Labs    S-Pt presented on  05/06/2022 with  Chief Complaint  Patient presents with   Abnormal Labs  .  Patient was seen today on second floor. Patient resting comfortably in the bed. Patient offers no new specific physical complaints patient friend was present in the room. Patient main concern continues to be what is her kidney function now I then discussed with the patient regarding her kidney related issues.   Medications  pantoprazole (PROTONIX) IV  40 mg Intravenous Q12H         EQA:STMHD from the symptoms mentioned above,there are no other symptoms referable to all systems reviewed.  Physical Exam: Vital signs in last 24 hours: Temp:  [97.5 F (36.4 C)-98.6 F (37 C)] 98.2 F (36.8 C) (07/09 0759) Pulse Rate:  [68-83] 71 (07/09 0759) Resp:  [16-18] 18 (07/09 0759) BP: (108-133)/(60-88) 122/68 (07/09 0759) SpO2:  [94 %-97 %] 94 % (07/09 0759) Weight:  [81 kg] 81 kg (07/09 0402) Weight change:  Last BM Date : 05/06/22  Intake/Output from previous day: 07/08 0701 - 07/09 0700 In: 530 [I.V.:530] Out: 0  No intake/output data recorded.   Physical Exam:  General- pt is awake,alert, oriented to time place and person  Resp- No acute REsp distress, CTA B/L NO Rhonchi  CVS- S1S2 regular in rate and rhythm  GIT- BS+, soft, Non tender , Non distended  EXT- No LE Edema,  No Cyanosis   Lab Results:  CBC  Recent Labs    05/06/22 1639 05/07/22 0613  WBC 9.2 6.6  HGB 11.2* 10.1*  HCT 34.9* 31.4*  PLT 265 253    BMET  Recent Labs    05/06/22 1639 05/07/22 0613  NA 139 139  K 4.4 3.9  CL 103 106  CO2 24 25  GLUCOSE 88 90  BUN 38* 35*  CREATININE 2.88* 2.60*  CALCIUM 8.7* 8.2*      Most recent Creatinine trend  Lab Results   Component Value Date   CREATININE 2.60 (H) 05/07/2022   CREATININE 2.88 (H) 05/06/2022   CREATININE 0.87 04/14/2021      MICRO   Recent Results (from the past 240 hour(s))  Gastrointestinal Panel by PCR , Stool     Status: None   Collection Time: 05/06/22  3:40 PM   Specimen: Stool  Result Value Ref Range Status   Campylobacter species NOT DETECTED NOT DETECTED Final   Plesimonas shigelloides NOT DETECTED NOT DETECTED Final   Salmonella species NOT DETECTED NOT DETECTED Final   Yersinia enterocolitica NOT DETECTED NOT DETECTED Final   Vibrio species NOT DETECTED NOT DETECTED Final   Vibrio cholerae NOT DETECTED NOT DETECTED Final   Enteroaggregative E coli (EAEC) NOT DETECTED NOT DETECTED Final   Enteropathogenic E coli (EPEC) NOT DETECTED NOT DETECTED Final   Enterotoxigenic E coli (ETEC) NOT DETECTED NOT DETECTED Final   Shiga like toxin producing E coli (STEC) NOT DETECTED NOT DETECTED Final   Shigella/Enteroinvasive E coli (EIEC) NOT DETECTED NOT DETECTED Final   Cryptosporidium NOT DETECTED NOT DETECTED Final   Cyclospora cayetanensis NOT DETECTED NOT DETECTED Final   Entamoeba histolytica NOT DETECTED NOT DETECTED Final   Giardia lamblia NOT DETECTED NOT DETECTED Final   Adenovirus F40/41 NOT DETECTED NOT DETECTED Final   Astrovirus NOT DETECTED  NOT DETECTED Final   Norovirus GI/GII NOT DETECTED NOT DETECTED Final   Rotavirus A NOT DETECTED NOT DETECTED Final   Sapovirus (I, II, IV, and V) NOT DETECTED NOT DETECTED Final    Comment: Performed at [facility], [address], [place], [place] [address]         Impression:   1)Renal     Acute kidney injury Patient has AKI secondary to ATN Data in favor of ATN as patient has had decreased oral intake Patient was on NSAID as an outpatient up till very recently    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 Patient UA shows hematuria and proteinuria Data in favor of GN as patient has had microscopic hematuria and proteinuria  going back to 2016 Patient has AKI on CKD 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   In favor of CKD as patient has had persistent hematuria Agree with holding NSAIDs   Patient AKI is minimally better Patient creatinine has trended down from approximately 2.9 to now 2.6  Patient urine spot protein creatinine ratio came back at less than 150 mg    Latest Reference Range & Units 05/06/22 20:05  Protein Creatinine Ratio 0.00 - 0.15 mg/mgCre 0.14   Patient autoimmune work-up is still pending    2)HTN Blood pressure is at goal   3)Anemia of chronic disease     Latest Ref Rng & Units 05/07/2022    6:13 AM 05/06/2022    4:39 PM 04/14/2021    6:21 AM  CBC  WBC 4.0 - 10.5 K/uL 6.6  9.2  8.8   Hemoglobin 12.0 - 15.0 g/dL 10.1  11.2  14.2   Hematocrit 36.0 - 46.0 % 31.4  34.9  41.2   Platelets 150 - 400 K/uL 253  265  270        HGb at goal (9--11) Will ask for anemia profile as patient hemoglobin is trending down   4) Secondary hyperparathyroidism -CKD Mineral-Bone Disorder    Lab Results  Component Value Date   CALCIUM 8.2 (L) 05/07/2022   We will check patient's phosphorus   5)UTI Does have history of multiple UTIs Patient is currently on antibiotics We will follow-up with urine culture   6) Electrolytes      Latest Ref Rng & Units 05/07/2022    6:13 AM 05/06/2022    4:39 PM 04/14/2021    6:21 AM  BMP  Glucose 70 - 99 mg/dL 90  88  129   BUN 6 - 20 mg/dL 35  38  14   Creatinine 0.44 - 1.00 mg/dL 2.60  2.88  0.87   Sodium 135 - 145 mmol/L 139  139  139   Potassium 3.5 - 5.1 mmol/L 3.9  4.4  3.6   Chloride 98 - 111 mmol/L 106  103  104   CO2 22 - 32 mmol/L '25  24  28   '$ Calcium 8.9 - 10.3 mg/dL 8.2  8.7  9.3      Sodium Normonatremic   Potassium Normokalemic        7)Acid base Co2 at goal     8)Chronic cystitis/incomplete bladder emptying. Patient has a history of incomplete bladder apnea We will ask for postvoid residual  I did reach out to  patient's nurse again to please see if patient postvoid residual can be checked   9)Colitis Patient is on antibiotics Primary team is following   Plan:  Awaiting autoimmune work-up Educated patient about her kidney related issues. We will try to get  patient postvoid residual We will ask for anemia work-up We will ask for phosphorus We will continue the current treatment plan      Maryln Eastham s Sontee Desena 05/07/2022, 1:26 PM

## 2022-05-08 ENCOUNTER — Encounter: Payer: Self-pay | Admitting: Internal Medicine

## 2022-05-08 ENCOUNTER — Inpatient Hospital Stay: Payer: BC Managed Care – PPO | Admitting: Anesthesiology

## 2022-05-08 ENCOUNTER — Encounter
Admission: EM | Disposition: A | Discharge status: Home / Self Care | Payer: Self-pay | Source: Ambulatory Visit | Attending: Internal Medicine

## 2022-05-08 DIAGNOSIS — N390 Urinary tract infection, site not specified: Secondary | ICD-10-CM

## 2022-05-08 DIAGNOSIS — E538 Deficiency of other specified B group vitamins: Secondary | ICD-10-CM

## 2022-05-08 DIAGNOSIS — N3281 Overactive bladder: Secondary | ICD-10-CM

## 2022-05-08 DIAGNOSIS — K920 Hematemesis: Secondary | ICD-10-CM

## 2022-05-08 DIAGNOSIS — D649 Anemia, unspecified: Secondary | ICD-10-CM

## 2022-05-08 DIAGNOSIS — F39 Unspecified mood [affective] disorder: Secondary | ICD-10-CM

## 2022-05-08 DIAGNOSIS — N179 Acute kidney failure, unspecified: Secondary | ICD-10-CM | POA: Diagnosis not present

## 2022-05-08 DIAGNOSIS — I10 Essential (primary) hypertension: Secondary | ICD-10-CM

## 2022-05-08 DIAGNOSIS — B9620 Unspecified Escherichia coli [E. coli] as the cause of diseases classified elsewhere: Secondary | ICD-10-CM

## 2022-05-08 DIAGNOSIS — K529 Noninfective gastroenteritis and colitis, unspecified: Secondary | ICD-10-CM

## 2022-05-08 HISTORY — PX: ESOPHAGOGASTRODUODENOSCOPY (EGD) WITH PROPOFOL: SHX5813

## 2022-05-08 LAB — CBC
HCT: 30 % — ABNORMAL LOW (ref 36.0–46.0)
Hemoglobin: 9.6 g/dL — ABNORMAL LOW (ref 12.0–15.0)
MCH: 30.9 pg (ref 26.0–34.0)
MCHC: 32 g/dL (ref 30.0–36.0)
MCV: 96.5 fL (ref 80.0–100.0)
Platelets: 247 10*3/uL (ref 150–400)
RBC: 3.11 MIL/uL — ABNORMAL LOW (ref 3.87–5.11)
RDW: 11.9 % (ref 11.5–15.5)
WBC: 6 10*3/uL (ref 4.0–10.5)
nRBC: 0 % (ref 0.0–0.2)

## 2022-05-08 LAB — BASIC METABOLIC PANEL
Anion gap: 6 (ref 5–15)
BUN: 25 mg/dL — ABNORMAL HIGH (ref 6–20)
CO2: 27 mmol/L (ref 22–32)
Calcium: 8.5 mg/dL — ABNORMAL LOW (ref 8.9–10.3)
Chloride: 110 mmol/L (ref 98–111)
Creatinine, Ser: 2.05 mg/dL — ABNORMAL HIGH (ref 0.44–1.00)
GFR, Estimated: 28 mL/min — ABNORMAL LOW (ref >60)
Glucose, Bld: 97 mg/dL (ref 70–99)
Potassium: 3.9 mmol/L (ref 3.5–5.1)
Sodium: 143 mmol/L (ref 135–145)

## 2022-05-08 LAB — URINE CULTURE: Culture: >=100000 — AB

## 2022-05-08 LAB — C4 COMPLEMENT: Complement C4, Body Fluid: 35 mg/dL (ref 12–38)

## 2022-05-08 LAB — ANA W/REFLEX IF POSITIVE: Anti Nuclear Antibody (ANA): NEGATIVE

## 2022-05-08 LAB — GLUCOSE, CAPILLARY: Glucose-Capillary: 97 mg/dL (ref 70–99)

## 2022-05-08 LAB — C3 COMPLEMENT: C3 Complement: 144 mg/dL (ref 82–167)

## 2022-05-08 LAB — CALPROTECTIN, FECAL: Calprotectin, Fecal: 67 ug/g (ref 0–120)

## 2022-05-08 SURGERY — ESOPHAGOGASTRODUODENOSCOPY (EGD) WITH PROPOFOL
Anesthesia: General

## 2022-05-08 MED ORDER — CEPHALEXIN 500 MG PO CAPS: 500.0000 mg | ORAL_CAPSULE | Freq: Three times a day (TID) | ORAL | 0 refills | Status: AC

## 2022-05-08 MED ORDER — SODIUM CHLORIDE 0.9 % IV SOLN
INTRAVENOUS | Status: DC
  Administered 2022-05-08: 1000 mL via INTRAVENOUS

## 2022-05-08 MED ORDER — PROPOFOL 500 MG/50ML IV EMUL
INTRAVENOUS | Status: DC | PRN
  Administered 2022-05-08: 140 ug/kg/min via INTRAVENOUS

## 2022-05-08 MED ORDER — PROPOFOL 10 MG/ML IV BOLUS
INTRAVENOUS | Status: DC | PRN
  Administered 2022-05-08: 30 mg via INTRAVENOUS
  Administered 2022-05-08: 90 mg via INTRAVENOUS

## 2022-05-08 MED ORDER — DEXMEDETOMIDINE (PRECEDEX) IN NS 20 MCG/5ML (4 MCG/ML) IV SYRINGE
PREFILLED_SYRINGE | INTRAVENOUS | Status: DC | PRN
  Administered 2022-05-08: 8 ug via INTRAVENOUS

## 2022-05-08 MED ORDER — LIDOCAINE HCL (CARDIAC) PF 100 MG/5ML IV SOSY
PREFILLED_SYRINGE | INTRAVENOUS | Status: DC | PRN
  Administered 2022-05-08: 80 mg via INTRAVENOUS

## 2022-05-08 NOTE — Plan of Care (Signed)

## 2022-05-08 NOTE — Anesthesia Preprocedure Evaluation (Signed)
Anesthesia Evaluation  Patient identified by MRN, date of birth, ID band Patient awake    Reviewed: Allergy & Precautions, NPO status , Patient's Chart, lab work & pertinent test results  History of Anesthesia Complications (+) PONV and history of anesthetic complications  Airway Mallampati: III  TM Distance: >3 FB Neck ROM: full    Dental  (+) Chipped   Pulmonary neg pulmonary ROS, neg shortness of breath,    Pulmonary exam normal        Cardiovascular hypertension, Normal cardiovascular exam     Neuro/Psych Anxiety  Neuromuscular disease    GI/Hepatic Neg liver ROS, GERD  Controlled,  Endo/Other  negative endocrine ROS  Renal/GU Renal disease  negative genitourinary   Musculoskeletal   Abdominal   Peds  Hematology negative hematology ROS (+)   Anesthesia Other Findings Patient is NPO appropriate and reports no nausea or vomiting today.  Past Medical History: No date: Anxiety No date: Arthritis No date: Complication of anesthesia No date: GERD (gastroesophageal reflux disease) No date: PONV (postoperative nausea and vomiting)     Comment:  NAUSEA  Past Surgical History: 12/15/2019: CYSTOSCOPY WITH BIOPSY; N/A     Comment:  Procedure: CYSTOSCOPY WITH BIOPSY;  Surgeon: Hollice Espy, MD;  Location: [facility] ORS;  Service: Urology;                Laterality: N/A; 05/30/2018: KNEE ARTHROSCOPY WITH MEDIAL MENISECTOMY; Right     Comment:  Procedure: KNEE ARTHROSCOPY WITH MEDIAL MENISECTOMY               PARTIAL SYNOVECTOMY;  Surgeon: Hessie Knows, MD;                Location: [facility] OR[facility];  Service: Orthopedics;  Laterality:               Right; No date: NASAL SINUS SURGERY No date: THYROID CYST EXCISION No date: UTERINE FIBROID SURGERY  BMI    Body Mass Index: 22.45 kg/m      Reproductive/Obstetrics negative OB ROS                             Anesthesia  Physical Anesthesia Plan  ASA: 3  Anesthesia Plan: General   Post-op Pain Management:    Induction: Intravenous  PONV Risk Score and Plan: Propofol infusion and TIVA  Airway Management Planned: Natural Airway and Nasal Cannula  Additional Equipment:   Intra-op Plan:   Post-operative Plan:   Informed Consent: I have reviewed the patients History and Physical, chart, labs and discussed the procedure including the risks, benefits and alternatives for the proposed anesthesia with the patient or authorized representative who has indicated his/her understanding and acceptance.     Dental Advisory Given  Plan Discussed with: Anesthesiologist, CRNA and Surgeon  Anesthesia Plan Comments: (Patient consented for risks of anesthesia including but not limited to:  - adverse reactions to medications - risk of airway placement if required - damage to eyes, teeth, lips or other oral mucosa - nerve damage due to positioning  - sore throat or hoarseness - Damage to heart, brain, nerves, lungs, other parts of body or loss of life  Patient voiced understanding.)        Anesthesia Quick Evaluation

## 2022-05-08 NOTE — H&P (View-Only) (Signed)
Inpatient Follow-up/Progress Note   Patient ID: Kelsey Short is a 56 y.o. female.  Overnight Events / Subjective Findings NAEON. Npo since midnight for egd today. No further n/v. Global cell line decrease in setting of ivf resuscitation. BM o/n were brown No abdominal pain. No other acute gi complaints.  Review of Systems  Constitutional:  Negative for activity change, appetite change, chills, diaphoresis, fatigue, fever and unexpected weight change.  HENT:  Negative for trouble swallowing and voice change.   Respiratory:  Negative for shortness of breath and wheezing.   Cardiovascular:  Negative for chest pain, palpitations and leg swelling.  Gastrointestinal:  Negative for abdominal distention, abdominal pain, anal bleeding, blood in stool, constipation, diarrhea, nausea, rectal pain and vomiting.  Musculoskeletal:  Negative for arthralgias and myalgias.  Skin:  Negative for color change and pallor.  Neurological:  Negative for dizziness, syncope and weakness.  Psychiatric/Behavioral:  Negative for confusion.   All other systems reviewed and are negative.    Medications  Current Facility-Administered Medications:    acetaminophen (TYLENOL) tablet 650 mg, 650 mg, Oral, Q6H PRN, Lavina Hamman, MD, 650 mg at 05/07/22 2244   albuterol (PROVENTIL) (2.5 MG/3ML) 0.083% nebulizer solution 2.5 mg, 2.5 mg, Nebulization, Q2H PRN, Myles Rosenthal A, MD   cefTRIAXone (ROCEPHIN) 1 g in sodium chloride 0.9 % 100 mL IVPB, 1 g, Intravenous, Q24H, Clance Boll, MD, Stopped at 05/07/22 1347   colestipol (COLESTID) tablet 1 g, 1 g, Oral, BID, Lavina Hamman, MD, 1 g at 05/07/22 2218   escitalopram (LEXAPRO) tablet 20 mg, 20 mg, Oral, QPM, Lavina Hamman, MD, 20 mg at 05/07/22 1749   HYDROcodone-acetaminophen (NORCO/VICODIN) 5-325 MG per tablet 1 tablet, 1 tablet, Oral, Daily PRN, Lavina Hamman, MD   lactated ringers infusion, , Intravenous, Continuous, Lavina Hamman, MD,  Last Rate: 100 mL/hr at 05/08/22 0147, Infusion Verify at 05/08/22 0147   melatonin tablet 10 mg, 10 mg, Oral, QHS, Lavina Hamman, MD, 10 mg at 05/07/22 2241   metroNIDAZOLE (FLAGYL) IVPB 500 mg, 500 mg, Intravenous, Q12H, Myles Rosenthal A, MD, Last Rate: 100 mL/hr at 05/08/22 0509, 500 mg at 05/08/22 0509   ondansetron (ZOFRAN) tablet 4 mg, 4 mg, Oral, Q6H PRN **OR** ondansetron (ZOFRAN) injection 4 mg, 4 mg, Intravenous, Q6H PRN, Myles Rosenthal A, MD, 4 mg at 05/06/22 2135   oxybutynin (DITROPAN) tablet 5 mg, 5 mg, Oral, Q8H PRN, Myles Rosenthal A, MD   pantoprazole (PROTONIX) injection 40 mg, 40 mg, Intravenous, Q12H, Myles Rosenthal A, MD, 40 mg at 05/07/22 2218   sucralfate (CARAFATE) tablet 1 g, 1 g, Oral, QID, Lavina Hamman, MD, 1 g at 05/07/22 2218   vitamin B-12 (CYANOCOBALAMIN) tablet 1,000 mcg, 1,000 mcg, Oral, Daily, Lavina Hamman, MD, 1,000 mcg at 05/07/22 1549  cefTRIAXone (ROCEPHIN)  IV Stopped (05/07/22 1347)   lactated ringers 100 mL/hr at 05/08/22 0147   metronidazole 500 mg (05/08/22 0509)    acetaminophen, albuterol, HYDROcodone-acetaminophen, ondansetron **OR** ondansetron (ZOFRAN) IV, oxybutynin   Objective    Vitals:   05/07/22 0759 05/07/22 2004 05/08/22 0500 05/08/22 0512  BP: 122/68 (!) 124/57  126/70  Pulse: 71 64  63  Resp: '18 17  17  '$ Temp: 98.2 F (36.8 C) 98.7 F (37.1 C)  97.8 F (36.6 C)  TempSrc: Oral     SpO2: 94% 95%  95%  Weight:   79.3 kg   Height:  Physical Exam Vitals and nursing note reviewed.  Constitutional:      General: She is not in acute distress.    Appearance: Normal appearance. She is not ill-appearing, toxic-appearing or diaphoretic.  HENT:     Head: Normocephalic and atraumatic.     Nose: Nose normal.     Mouth/Throat:     Mouth: Mucous membranes are moist.     Pharynx: Oropharynx is clear.  Eyes:     General: No scleral icterus.    Extraocular Movements: Extraocular movements intact.   Cardiovascular:     Rate and Rhythm: Normal rate and regular rhythm.     Heart sounds: Normal heart sounds. No murmur heard.    No friction rub. No gallop.  Pulmonary:     Effort: Pulmonary effort is normal. No respiratory distress.     Breath sounds: Normal breath sounds. No wheezing, rhonchi or rales.  Abdominal:     General: Abdomen is flat. Bowel sounds are normal. There is no distension.     Palpations: Abdomen is soft.     Tenderness: There is no abdominal tenderness. There is no guarding or rebound.  Musculoskeletal:     Cervical back: Neck supple.     Right lower leg: No edema.     Left lower leg: No edema.  Skin:    General: Skin is warm and dry.     Coloration: Skin is not jaundiced or pale.  Neurological:     General: No focal deficit present.     Mental Status: She is alert and oriented to person, place, and time. Mental status is at baseline.  Psychiatric:        Mood and Affect: Mood normal.        Behavior: Behavior normal.        Thought Content: Thought content normal.        Judgment: Judgment normal.      Laboratory Data Recent Labs  Lab 05/06/22 1639 05/07/22 0613 05/08/22 0317  WBC 9.2 6.6 6.0  HGB 11.2* 10.1* 9.6*  HCT 34.9* 31.4* 30.0*  PLT 265 253 247  NEUTOPHILPCT 69  --   --   LYMPHOPCT 20  --   --   MONOPCT 9  --   --   EOSPCT 1  --   --    Recent Labs  Lab 05/06/22 1639 05/07/22 0613 05/08/22 0317  NA 139 139 143  K 4.4 3.9 3.9  CL 103 106 110  CO2 '24 25 27  '$ BUN 38* 35* 25*  CREATININE 2.88* 2.60* 2.05*  CALCIUM 8.7* 8.2* 8.5*  PROT  --  6.1*  --   BILITOT  --  1.3*  --   ALKPHOS  --  56  --   ALT  --  15  --   AST  --  18  --   GLUCOSE 88 90 97   Recent Labs  Lab 05/07/22 0613  INR 1.1      Imaging Studies: CT Renal Stone Study  Result Date: 05/06/2022 CLINICAL DATA:  Recurrent UTIs.  Acute kidney injury. EXAM: CT ABDOMEN AND PELVIS WITHOUT CONTRAST TECHNIQUE: Multidetector CT imaging of the abdomen and pelvis was  performed following the standard protocol without IV contrast. RADIATION DOSE REDUCTION: This exam was performed according to the departmental dose-optimization program which includes automated exposure control, adjustment of the mA and/or kV according to patient size and/or use of iterative reconstruction technique. COMPARISON:  CT abdomen pelvis dated November 18, 2019. FINDINGS: Lower  chest: No acute abnormality. Hepatobiliary: No focal liver abnormality is seen. Status post cholecystectomy. No biliary dilatation. Pancreas: Unremarkable. No pancreatic ductal dilatation or surrounding inflammatory changes. Spleen: Normal in size without focal abnormality. Adrenals/Urinary Tract: Adrenal glands are unremarkable. Kidneys are normal, without renal calculi, focal lesion, or hydronephrosis. Bladder is mostly decompressed. Stomach/Bowel: The stomach is within normal limits. Circumferential wall thickening of the cecum and proximal ascending colon with minimal surrounding inflammatory change. No obstruction. Normal appendix. Vascular/Lymphatic: No significant vascular findings are present. No enlarged abdominal or pelvic lymph nodes. Reproductive: IUD appropriately positioned within the uterus. No adnexal mass. Other: No abdominal wall hernia or abnormality. No abdominopelvic ascites. No pneumoperitoneum. Musculoskeletal: No acute or significant osseous findings. IMPRESSION: 1. Circumferential wall thickening of the cecum and proximal ascending colon with minimal surrounding inflammatory change, consistent with colitis. Electronically Signed   By: Titus Dubin M.D.   On: 05/06/2022 13:20    Assessment:   # Reported coffee ground emesis - Pt noted x2 on Thursday and once on Friday - no further episodes - BM have been brown   # Normocytic anemia - hgb has been stable for last month and repeated draws are the same. After IVF and blood draws, slightly decreased to 10.1; hgb 14 last year - no current signs of  GIB - chronic nsaid use- celebrex - only recently started on ppi - vss; bun/cr ratio ~13   # Circumferential wall thickening of right colon on CT w/o inflammation - diarrheal sx controlled by imodium - no abdominal pain or fever - pt w/o nocturnal awakening - BM preceded by abdominal cramping and improved by defecation - infectious and inflammatory markers negative - suspect element of bile salt diarrhea from cholecystectomy   # chronic diarrhea  # AKI - nsaid use, lack of po intake - being followed by nephrology in hospital    Plan:  Hgb decreasing along with other cell lines in setting of IVF resuscitation with AKI No signs of gib Plan for egd today Npo since midnight Am labs reviewed Continue protonix 40 mg iv q12h Hold dvt ppx Monitor H&H.  Transfusion and resuscitation as per primary team Avoid frequent lab draws to prevent lab induced anemia Supportive care and antiemetics as per primary team Abx not required for GI conditions at this time Maintain two sites IV access Avoid nsaids Monitor for GIB. Recommended to pt to initiate colestipol and keep imodium as prn for increased diarrheal sx Discontinue carafate as has no impact on ulcer healing if present Patient's preference is to perform colonoscopy as an outpatient.  Further recommendations pending endoscopy. Please see op report for further details  Esophagogastroduodenoscopy with possible biopsy, control of bleeding, polypectomy, and interventions as necessary has been discussed with the patient/patient representative. Informed consent was obtained from the patient/patient representative after explaining the indication, nature, and risks of the procedure including but not limited to death, bleeding, perforation, missed neoplasm/lesions, cardiorespiratory compromise, and reaction to medications. Opportunity for questions was given and appropriate answers were provided. Patient/patient representative has verbalized  understanding is amenable to undergoing the procedure.  I personally performed the service.  Management of other medical comorbidities as per primary team  Thank you for allowing [place] to participate in this patient's care. Please don't hesitate to call if any questions or concerns arise.   Annamaria Helling, DO [facility]  Portions of the record may have been created with voice recognition software. Occasional wrong-word or 'sound-a-like' substitutions may have occurred due  to the inherent limitations of voice recognition software.  Read the chart carefully and recognize, using context, where substitutions may have occurred.

## 2022-05-08 NOTE — Progress Notes (Signed)
[facility]  ROUNDING NOTE   Subjective:    Patient seen sitting up in bed Alert and oriented Tolerating meals without nausea and vomiting Denies burning with urination   Objective:  Vital signs in last 24 hours:  Temp:  [97.8 F (36.6 C)-98.9 F (37.2 C)] 98 F (36.7 C) (07/10 1309) Pulse Rate:  [59-70] 70 (07/10 1329) Resp:  [14-28] 14 (07/10 1329) BP: (113-141)/(57-72) 120/71 (07/10 1329) SpO2:  [93 %-96 %] 95 % (07/10 1329) Weight:  [79.3 kg] 79.3 kg (07/10 0500)  Weight change: -1.7 kg Filed Weights   05/07/22 0402 05/08/22 0500  Weight: 81 kg 79.3 kg    Intake/Output: I/O last 3 completed shifts: In: 1930.2 [I.V.:1531; IV Piggyback:399.2] Out: 0    Intake/Output this shift:  Total I/O In: 1003.3 [I.V.:1003.3] Out: -   Physical Exam: General: NAD  Head: Normocephalic, atraumatic. Moist oral mucosal membranes  Eyes: Anicteric  Lungs:  Clear to auscultation, normal effort  Heart: Regular rate and rhythm  Abdomen:  Soft, nontender  Extremities:  No peripheral edema.  Neurologic: Nonfocal, moving all four extremities  Skin: No lesions  Access: None    Basic Metabolic Panel: Recent Labs  Lab 05/06/22 1639 05/07/22 0613 05/08/22 0317  NA 139 139 143  K 4.4 3.9 3.9  CL 103 106 110  CO2 '24 25 27  '$ GLUCOSE 88 90 97  BUN 38* 35* 25*  CREATININE 2.88* 2.60* 2.05*  CALCIUM 8.7* 8.2* 8.5*  PHOS  --  4.2  --     Liver Function Tests: Recent Labs  Lab 05/07/22 0613  AST 18  ALT 15  ALKPHOS 56  BILITOT 1.3*  PROT 6.1*  ALBUMIN 3.7   No results for input(s): "LIPASE", "AMYLASE" in the last 168 hours. No results for input(s): "AMMONIA" in the last 168 hours.  CBC: Recent Labs  Lab 05/06/22 1639 05/07/22 0613 05/08/22 0317  WBC 9.2 6.6 6.0  NEUTROABS 6.3  --   --   HGB 11.2* 10.1* 9.6*  HCT 34.9* 31.4* 30.0*  MCV 95.4 95.4 96.5  PLT 265 253 247    Cardiac Enzymes: No results for input(s): "CKTOTAL", "CKMB", "CKMBINDEX",  "TROPONINI" in the last 168 hours.  BNP: Invalid input(s): "POCBNP"  CBG: Recent Labs  Lab 05/07/22 0819 05/08/22 0745  GLUCAP 93 97    Microbiology: Results for orders placed or performed during the hospital encounter of 05/06/22  Urine Culture     Status: Abnormal   Collection Time: 05/06/22 12:08 PM   Specimen: Urine, Clean Catch  Result Value Ref Range Status   Specimen Description   Final    URINE, CLEAN CATCH Performed at [facility], [address], [place], [place] [address]    Special Requests   Final    NONE Performed at [facility], [address] [place], [place] [address]    Culture >=100,000 COLONIES/mL ESCHERICHIA COLI (A)  Final   Report Status 05/08/2022 FINAL  Final   Organism ID, Bacteria ESCHERICHIA COLI (A)  Final      Susceptibility   Escherichia coli - MIC*    AMPICILLIN 8 SENSITIVE Sensitive     CEFAZOLIN <=4 SENSITIVE Sensitive     CEFEPIME <=0.12 SENSITIVE Sensitive     CEFTRIAXONE <=0.25 SENSITIVE Sensitive     CIPROFLOXACIN <=0.25 SENSITIVE Sensitive     GENTAMICIN <=1 SENSITIVE Sensitive     IMIPENEM <=0.25 SENSITIVE Sensitive     NITROFURANTOIN <=16 SENSITIVE Sensitive  TRIMETH/SULFA <=20 SENSITIVE Sensitive     AMPICILLIN/SULBACTAM <=2 SENSITIVE Sensitive     PIP/TAZO <=4 SENSITIVE Sensitive     * >=100,000 COLONIES/mL ESCHERICHIA COLI  Gastrointestinal Panel by PCR , Stool     Status: None   Collection Time: 05/06/22  3:40 PM   Specimen: Stool  Result Value Ref Range Status   Campylobacter species NOT DETECTED NOT DETECTED Final   Plesimonas shigelloides NOT DETECTED NOT DETECTED Final   Salmonella species NOT DETECTED NOT DETECTED Final   Yersinia enterocolitica NOT DETECTED NOT DETECTED Final   Vibrio species NOT DETECTED NOT DETECTED Final   Vibrio cholerae NOT DETECTED NOT DETECTED Final   Enteroaggregative E coli (EAEC) NOT DETECTED NOT DETECTED Final   Enteropathogenic E coli (EPEC) NOT DETECTED NOT  DETECTED Final   Enterotoxigenic E coli (ETEC) NOT DETECTED NOT DETECTED Final   Shiga like toxin producing E coli (STEC) NOT DETECTED NOT DETECTED Final   Shigella/Enteroinvasive E coli (EIEC) NOT DETECTED NOT DETECTED Final   Cryptosporidium NOT DETECTED NOT DETECTED Final   Cyclospora cayetanensis NOT DETECTED NOT DETECTED Final   Entamoeba histolytica NOT DETECTED NOT DETECTED Final   Giardia lamblia NOT DETECTED NOT DETECTED Final   Adenovirus F40/41 NOT DETECTED NOT DETECTED Final   Astrovirus NOT DETECTED NOT DETECTED Final   Norovirus GI/GII NOT DETECTED NOT DETECTED Final   Rotavirus A NOT DETECTED NOT DETECTED Final   Sapovirus (I, II, IV, and V) NOT DETECTED NOT DETECTED Final    Comment: Performed at [facility], [address] [place], [place] [address]    Coagulation Studies: Recent Labs    05/07/22 2440  LABPROT 14.5  INR 1.1    Urinalysis: Recent Labs    05/06/22 1208  COLORURINE ORANGE*  LABSPEC 1.011  PHURINE TEST NOT REPORTED DUE TO COLOR INTERFERENCE OF URINE PIGMENT  GLUCOSEU TEST NOT REPORTED DUE TO COLOR INTERFERENCE OF URINE PIGMENT*  HGBUR TEST NOT REPORTED DUE TO COLOR INTERFERENCE OF URINE PIGMENT*  BILIRUBINUR TEST NOT REPORTED DUE TO COLOR INTERFERENCE OF URINE PIGMENT*  KETONESUR TEST NOT REPORTED DUE TO COLOR INTERFERENCE OF URINE PIGMENT*  PROTEINUR TEST NOT REPORTED DUE TO COLOR INTERFERENCE OF URINE PIGMENT*  NITRITE TEST NOT REPORTED DUE TO COLOR INTERFERENCE OF URINE PIGMENT*  LEUKOCYTESUR TEST NOT REPORTED DUE TO COLOR INTERFERENCE OF URINE PIGMENT*      Imaging: No results found.   Medications:    sodium chloride 20 mL/hr at 05/08/22 1255   [MAR Hold] cefTRIAXone (ROCEPHIN)  IV 1 g (05/08/22 1157)   lactated ringers 100 mL/hr at 05/08/22 1149   [MAR Hold] metronidazole 500 mg (05/08/22 0509)    [MAR Hold] colestipol  1 g Oral BID   [MAR Hold] escitalopram  20 mg Oral QPM   [MAR Hold] melatonin  10 mg Oral QHS    [MAR Hold] pantoprazole (PROTONIX) IV  40 mg Intravenous Q12H   [MAR Hold] sucralfate  1 g Oral QID   [MAR Hold] vitamin B-12  1,000 mcg Oral Daily   [MAR Hold] acetaminophen, [MAR Hold] albuterol, [MAR Hold] HYDROcodone-acetaminophen, [MAR Hold] ondansetron **OR** [MAR Hold] ondansetron (ZOFRAN) IV, [MAR Hold] oxybutynin  Assessment/ Plan:  Ms. Kelsey Short is a 56 y.o.  female with past medical history of GERD, anxiety, and arthritis. Patient presented to the ED with painful urination. Patient was admitted for AKI (acute kidney injury) ([place]) [N17.9] Urinary tract infection without hematuria, site unspecified [N39.0] Acute renal failure, unspecified acute renal failure type (  [place]) [N17.9] Acute renal failure (ARF) ([place]) [N17.9]   Acute Kidney Injury likely secondary to ATN due to decreased oral intake and NSAID use. Recurrent history of UTI's. Renal function improving with treatment and IVF. Patient asked multiple questions about possible medications that affect renal function. Encouraged to follow up with urology to evaluate frequent causes. Autoimmune workup still pending due to abnormal UA, but patient cleared to discharge from renal stance for follow up outpatient.   Lab Results  Component Value Date   CREATININE 2.05 (H) 05/08/2022   CREATININE 2.60 (H) 05/07/2022   CREATININE 2.88 (H) 05/06/2022    Intake/Output Summary (Last 24 hours) at 05/08/2022 1352 Last data filed at 05/08/2022 1149 Gross per 24 hour  Intake 2403.54 ml  Output --  Net 2403.54 ml   2. Anemia of chronic kidney disease Lab Results  Component Value Date   HGB 9.6 (L) 05/08/2022    Hgb within acceptable range.   3. UTI, recurrent. Receiving Ceftriaxone and metronidazole    LOS: 2 Joann Kulpa 7/10/20231:52 PM

## 2022-05-08 NOTE — Interval H&P Note (Signed)
History and Physical Interval Note: Progress Note from 05/08/22  was reviewed and there was no interval change after seeing and examining the patient.  Written consent was obtained from the patient after discussion of risks, benefits, and alternatives. Patient has consented to proceed with Esophagogastroduodenoscopy with possible intervention   05/08/2022 12:49 PM  Kelsey Short  has presented today for surgery, with the diagnosis of coffee ground emesis, anemia.  The various methods of treatment have been discussed with the patient and family. After consideration of risks, benefits and other options for treatment, the patient has consented to  Procedure(s): ESOPHAGOGASTRODUODENOSCOPY (EGD) WITH PROPOFOL (N/A) as a surgical intervention.  The patient's history has been reviewed, patient examined, no change in status, stable for surgery.  I have reviewed the patient's chart and labs.  Questions were answered to the patient's satisfaction.     Annamaria Helling

## 2022-05-08 NOTE — Progress Notes (Addendum)
Initial Nutrition Assessment  DOCUMENTATION CODES:   Not applicable  INTERVENTION:   Recommend Ensure Enlive po TID, each supplement provides 350 kcal and 20 grams of protein.  Recommend MVI po daily   Pt at high refeed risk; recommend monitor potassium, magnesium and phosphorus labs daily until stable  NUTRITION DIAGNOSIS:   Inadequate oral intake related to acute illness as evidenced by per patient/family report.  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  Pt s/p EGD today; pt noted to have gastritis.   Unable to see pt as pt in procedure at time of RD visit. Per chart review, pt with decreased appetite and oral intake for 2 weeks pta r/t nausea and vomiting. Pt reports that she has not eaten solid foods since 7/5. Pt reports chronic diarrhea secondary to cholecystectomy. Per chart, pt is down 7lbs(4%) over the past month; this is not significant. Pt NPO today for EGD. RD will add Ensure with diet advancement. Pt is likely at refeed risk. RD will obtain nutrition related exam and history at follow-up.   Medications reviewed and include: colestipol, melatonin, protonix, carafate, B12, ceftriaxone, LRS '@100ml'$ /hr, metronidazole   Labs reviewed: K 3.9 wnl, BUN 25(H), creat 2.05(H), P 4.2 wnl Hgb 9.6(L), Hct 30.0(L)  NUTRITION - FOCUSED PHYSICAL EXAM: Unable to perform at this time   Diet Order:   Diet Order             Diet regular Room service appropriate? Yes; Fluid consistency: Thin  Diet effective now           Diet general                  EDUCATION NEEDS:   No education needs have been identified  at this time  Skin:  Skin Assessment: Reviewed RN Assessment  Last BM:  7/8  Height:   Ht Readings from Last 1 Encounters:  05/06/22 '6\' 2"'$  (1.88 m)    Weight:   Wt Readings from Last 1 Encounters:  05/08/22 79.3 kg    Ideal Body Weight:  77.2 kg  BMI:  Body mass index is 22.45 kg/m.  Estimated Nutritional Needs:   Kcal:  1900-2200kcal/day  Protein:  95-110g/day  Fluid:  2.3-2.6L/day  Koleen Distance MS, RD, LDN Please refer to [facility] for RD and/or RD on-call/weekend/after hours pager

## 2022-05-08 NOTE — Plan of Care (Signed)
  Problem: Education: Goal: Knowledge of General Education information will improve Description: Including pain rating scale, medication(s)/side effects and non-pharmacologic comfort measures 05/08/2022 1535 by Evelena Peat, RN Outcome: Completed/Met 05/08/2022 1014 by Evelena Peat, RN Outcome: Progressing   Problem: Health Behavior/Discharge Planning: Goal: Ability to manage health-related needs will improve 05/08/2022 1535 by Evelena Peat, RN Outcome: Completed/Met 05/08/2022 1014 by Evelena Peat, RN Outcome: Progressing   Problem: Clinical Measurements: Goal: Ability to maintain clinical measurements within normal limits will improve 05/08/2022 1535 by Evelena Peat, RN Outcome: Completed/Met 05/08/2022 1014 by Evelena Peat, RN Outcome: Progressing Goal: Will remain free from infection 05/08/2022 1535 by Evelena Peat, RN Outcome: Completed/Met 05/08/2022 1014 by Evelena Peat, RN Outcome: Progressing Goal: Diagnostic test results will improve 05/08/2022 1535 by Evelena Peat, RN Outcome: Completed/Met 05/08/2022 1014 by Evelena Peat, RN Outcome: Progressing Goal: Respiratory complications will improve 05/08/2022 1535 by Evelena Peat, RN Outcome: Completed/Met 05/08/2022 1014 by Evelena Peat, RN Outcome: Progressing Goal: Cardiovascular complication will be avoided 05/08/2022 1535 by Evelena Peat, RN Outcome: Completed/Met 05/08/2022 1014 by Evelena Peat, RN Outcome: Progressing

## 2022-05-08 NOTE — Op Note (Signed)
[facility] Patient Name: Kelsey Short Procedure Date: 05/08/2022 12:44 PM MRN: [id number] Account #: [id number] Date of Birth: [birth year] Admit Type: Inpatient Age: 56 Room: [facility] ENDO ROOM 2 Gender: Female Note Status: Finalized Instrument Name: Upper Endoscope [id number] Procedure:             Upper GI endoscopy Indications:           Coffee-ground emesis, Anemia Providers:             Annamaria Helling DO, DO Referring MD:          No Local Md, MD (Referring MD) Medicines:             Monitored Anesthesia Care Complications:         No immediate complications. Estimated blood loss:                         Minimal. Procedure:             Pre-Anesthesia Assessment:                        - Prior to the procedure, a History and Physical was                         performed, and patient medications and allergies were                         reviewed. The patient is competent. The risks and                         benefits of the procedure and the sedation options and                         risks were discussed with the patient. All questions                         were answered and informed consent was obtained.                         Patient identification and proposed procedure were                         verified by the physician, the nurse, the anesthetist                         and the technician in the endoscopy suite. Mental                         Status Examination: alert and oriented. Airway                         Examination: normal oropharyngeal airway and neck                         mobility. Respiratory Examination: clear to                         auscultation. CV Examination: RRR, no murmurs, no S3  or S4. Prophylactic Antibiotics: The patient does not                         require prophylactic antibiotics. Prior                         Anticoagulants: The patient has taken no previous                          anticoagulant or antiplatelet agents. ASA Grade                         Assessment: III - A patient with severe systemic                         disease. After reviewing the risks and benefits, the                         patient was deemed in satisfactory condition to                         undergo the procedure. The anesthesia plan was to use                         monitored anesthesia care (MAC). Immediately prior to                         administration of medications, the patient was                         re-assessed for adequacy to receive sedatives. The                         heart rate, respiratory rate, oxygen saturations,                         blood pressure, adequacy of pulmonary ventilation, and                         response to care were monitored throughout the                         procedure. The physical status of the patient was                         re-assessed after the procedure.                        After obtaining informed consent, the endoscope was                         passed under direct vision. Throughout the procedure,                         the patient's blood pressure, pulse, and oxygen                         saturations were monitored continuously. The  Endosonoscope was introduced through the mouth, and                         advanced to the second part of duodenum. The upper GI                         endoscopy was accomplished without difficulty. The                         patient tolerated the procedure well. Findings:      The duodenal bulb, first portion of the duodenum and second portion of       the duodenum were normal. Estimated blood loss: none.      Scattered mild inflammation characterized by erythema and granularity       was found in the entire examined stomach. Biopsies were taken with a       cold forceps for Helicobacter pylori testing. Estimated blood loss was       minimal.       Bilious fluid was found in the entire examined stomach. this was likely       the "coffee ground emesis" the patient visualized. Estimated blood loss:       none.      The Z-line was regular. Estimated blood loss: none.      Esophagogastric landmarks were identified: the gastroesophageal junction       was found at 40 cm from the incisors.      The exam of the esophagus was otherwise normal. Impression:            - Normal duodenal bulb, first portion of the duodenum                         and second portion of the duodenum.                        - Gastritis. Biopsied.                        - Bilious gastric fluid.                        - Z-line regular.                        - Esophagogastric landmarks identified. Recommendation:        - Return patient to hospital ward for ongoing care.                        - Resume regular diet.                        - Continue present medications.                        - No aspirin, ibuprofen, naproxen, or other                         non-steroidal anti-inflammatory drugs.                        - continue protonix 40 mg twice daily for 8 weeks.  Plan for outpatient office visit and set up for                         colonoscopy. Our office will be in contact upon                         discharge. If have not heard within a week- call                         [phone].                        Resume colestipol                        - Await pathology results.                        - Return to GI clinic at appointment to be scheduled.                        - The findings and recommendations were discussed with                         the patient.                        - The findings and recommendations were discussed with                         the referring physician. Procedure Code(s):     --- Professional ---                        [phone], Esophagogastroduodenoscopy, flexible,                          transoral; with biopsy, single or multiple Diagnosis Code(s):     --- Professional ---                        K29.70, Gastritis, unspecified, without bleeding                        K92.0, Hematemesis                        D64.9, Anemia, unspecified CPT copyright 2019 [employer]. All rights reserved. The codes documented in this report are preliminary and upon coder review may  be revised to meet current compliance requirements. Attending Participation:      I personally performed the entire procedure. Volney American, DO Annamaria Helling DO, DO 05/08/2022 1:13:12 PM This report has been signed electronically. Number of Addenda: 0 Note Initiated On: 05/08/2022 12:44 PM Estimated Blood Loss:  Estimated blood loss was minimal.      [facility]

## 2022-05-08 NOTE — Progress Notes (Signed)
Inpatient Follow-up/Progress Note   Patient ID: Kelsey Short is a 56 y.o. female.  Overnight Events / Subjective Findings NAEON. Npo since midnight for egd today. No further n/v. Global cell line decrease in setting of ivf resuscitation. BM o/n were brown No abdominal pain. No other acute gi complaints.  Review of Systems  Constitutional:  Negative for activity change, appetite change, chills, diaphoresis, fatigue, fever and unexpected weight change.  HENT:  Negative for trouble swallowing and voice change.   Respiratory:  Negative for shortness of breath and wheezing.   Cardiovascular:  Negative for chest pain, palpitations and leg swelling.  Gastrointestinal:  Negative for abdominal distention, abdominal pain, anal bleeding, blood in stool, constipation, diarrhea, nausea, rectal pain and vomiting.  Musculoskeletal:  Negative for arthralgias and myalgias.  Skin:  Negative for color change and pallor.  Neurological:  Negative for dizziness, syncope and weakness.  Psychiatric/Behavioral:  Negative for confusion.   All other systems reviewed and are negative.    Medications  Current Facility-Administered Medications:    acetaminophen (TYLENOL) tablet 650 mg, 650 mg, Oral, Q6H PRN, Lavina Hamman, MD, 650 mg at 05/07/22 2244   albuterol (PROVENTIL) (2.5 MG/3ML) 0.083% nebulizer solution 2.5 mg, 2.5 mg, Nebulization, Q2H PRN, Myles Rosenthal A, MD   cefTRIAXone (ROCEPHIN) 1 g in sodium chloride 0.9 % 100 mL IVPB, 1 g, Intravenous, Q24H, Clance Boll, MD, Stopped at 05/07/22 1347   colestipol (COLESTID) tablet 1 g, 1 g, Oral, BID, Lavina Hamman, MD, 1 g at 05/07/22 2218   escitalopram (LEXAPRO) tablet 20 mg, 20 mg, Oral, QPM, Lavina Hamman, MD, 20 mg at 05/07/22 1749   HYDROcodone-acetaminophen (NORCO/VICODIN) 5-325 MG per tablet 1 tablet, 1 tablet, Oral, Daily PRN, Lavina Hamman, MD   lactated ringers infusion, , Intravenous, Continuous, Lavina Hamman, MD,  Last Rate: 100 mL/hr at 05/08/22 0147, Infusion Verify at 05/08/22 0147   melatonin tablet 10 mg, 10 mg, Oral, QHS, Lavina Hamman, MD, 10 mg at 05/07/22 2241   metroNIDAZOLE (FLAGYL) IVPB 500 mg, 500 mg, Intravenous, Q12H, Myles Rosenthal A, MD, Last Rate: 100 mL/hr at 05/08/22 0509, 500 mg at 05/08/22 0509   ondansetron (ZOFRAN) tablet 4 mg, 4 mg, Oral, Q6H PRN **OR** ondansetron (ZOFRAN) injection 4 mg, 4 mg, Intravenous, Q6H PRN, Myles Rosenthal A, MD, 4 mg at 05/06/22 2135   oxybutynin (DITROPAN) tablet 5 mg, 5 mg, Oral, Q8H PRN, Myles Rosenthal A, MD   pantoprazole (PROTONIX) injection 40 mg, 40 mg, Intravenous, Q12H, Myles Rosenthal A, MD, 40 mg at 05/07/22 2218   sucralfate (CARAFATE) tablet 1 g, 1 g, Oral, QID, Lavina Hamman, MD, 1 g at 05/07/22 2218   vitamin B-12 (CYANOCOBALAMIN) tablet 1,000 mcg, 1,000 mcg, Oral, Daily, Lavina Hamman, MD, 1,000 mcg at 05/07/22 1549  cefTRIAXone (ROCEPHIN)  IV Stopped (05/07/22 1347)   lactated ringers 100 mL/hr at 05/08/22 0147   metronidazole 500 mg (05/08/22 0509)    acetaminophen, albuterol, HYDROcodone-acetaminophen, ondansetron **OR** ondansetron (ZOFRAN) IV, oxybutynin   Objective    Vitals:   05/07/22 0759 05/07/22 2004 05/08/22 0500 05/08/22 0512  BP: 122/68 (!) 124/57  126/70  Pulse: 71 64  63  Resp: '18 17  17  '$ Temp: 98.2 F (36.8 C) 98.7 F (37.1 C)  97.8 F (36.6 C)  TempSrc: Oral     SpO2: 94% 95%  95%  Weight:   79.3 kg   Height:  Physical Exam Vitals and nursing note reviewed.  Constitutional:      General: She is not in acute distress.    Appearance: Normal appearance. She is not ill-appearing, toxic-appearing or diaphoretic.  HENT:     Head: Normocephalic and atraumatic.     Nose: Nose normal.     Mouth/Throat:     Mouth: Mucous membranes are moist.     Pharynx: Oropharynx is clear.  Eyes:     General: No scleral icterus.    Extraocular Movements: Extraocular movements intact.   Cardiovascular:     Rate and Rhythm: Normal rate and regular rhythm.     Heart sounds: Normal heart sounds. No murmur heard.    No friction rub. No gallop.  Pulmonary:     Effort: Pulmonary effort is normal. No respiratory distress.     Breath sounds: Normal breath sounds. No wheezing, rhonchi or rales.  Abdominal:     General: Abdomen is flat. Bowel sounds are normal. There is no distension.     Palpations: Abdomen is soft.     Tenderness: There is no abdominal tenderness. There is no guarding or rebound.  Musculoskeletal:     Cervical back: Neck supple.     Right lower leg: No edema.     Left lower leg: No edema.  Skin:    General: Skin is warm and dry.     Coloration: Skin is not jaundiced or pale.  Neurological:     General: No focal deficit present.     Mental Status: She is alert and oriented to person, place, and time. Mental status is at baseline.  Psychiatric:        Mood and Affect: Mood normal.        Behavior: Behavior normal.        Thought Content: Thought content normal.        Judgment: Judgment normal.      Laboratory Data Recent Labs  Lab 05/06/22 1639 05/07/22 0613 05/08/22 0317  WBC 9.2 6.6 6.0  HGB 11.2* 10.1* 9.6*  HCT 34.9* 31.4* 30.0*  PLT 265 253 247  NEUTOPHILPCT 69  --   --   LYMPHOPCT 20  --   --   MONOPCT 9  --   --   EOSPCT 1  --   --    Recent Labs  Lab 05/06/22 1639 05/07/22 0613 05/08/22 0317  NA 139 139 143  K 4.4 3.9 3.9  CL 103 106 110  CO2 '24 25 27  '$ BUN 38* 35* 25*  CREATININE 2.88* 2.60* 2.05*  CALCIUM 8.7* 8.2* 8.5*  PROT  --  6.1*  --   BILITOT  --  1.3*  --   ALKPHOS  --  56  --   ALT  --  15  --   AST  --  18  --   GLUCOSE 88 90 97   Recent Labs  Lab 05/07/22 0613  INR 1.1      Imaging Studies: CT Renal Stone Study  Result Date: 05/06/2022 CLINICAL DATA:  Recurrent UTIs.  Acute kidney injury. EXAM: CT ABDOMEN AND PELVIS WITHOUT CONTRAST TECHNIQUE: Multidetector CT imaging of the abdomen and pelvis was  performed following the standard protocol without IV contrast. RADIATION DOSE REDUCTION: This exam was performed according to the departmental dose-optimization program which includes automated exposure control, adjustment of the mA and/or kV according to patient size and/or use of iterative reconstruction technique. COMPARISON:  CT abdomen pelvis dated November 18, 2019. FINDINGS: Lower  chest: No acute abnormality. Hepatobiliary: No focal liver abnormality is seen. Status post cholecystectomy. No biliary dilatation. Pancreas: Unremarkable. No pancreatic ductal dilatation or surrounding inflammatory changes. Spleen: Normal in size without focal abnormality. Adrenals/Urinary Tract: Adrenal glands are unremarkable. Kidneys are normal, without renal calculi, focal lesion, or hydronephrosis. Bladder is mostly decompressed. Stomach/Bowel: The stomach is within normal limits. Circumferential wall thickening of the cecum and proximal ascending colon with minimal surrounding inflammatory change. No obstruction. Normal appendix. Vascular/Lymphatic: No significant vascular findings are present. No enlarged abdominal or pelvic lymph nodes. Reproductive: IUD appropriately positioned within the uterus. No adnexal mass. Other: No abdominal wall hernia or abnormality. No abdominopelvic ascites. No pneumoperitoneum. Musculoskeletal: No acute or significant osseous findings. IMPRESSION: 1. Circumferential wall thickening of the cecum and proximal ascending colon with minimal surrounding inflammatory change, consistent with colitis. Electronically Signed   By: Titus Dubin M.D.   On: 05/06/2022 13:20    Assessment:   # Reported coffee ground emesis - Pt noted x2 on Thursday and once on Friday - no further episodes - BM have been brown   # Normocytic anemia - hgb has been stable for last month and repeated draws are the same. After IVF and blood draws, slightly decreased to 10.1; hgb 14 last year - no current signs of  GIB - chronic nsaid use- celebrex - only recently started on ppi - vss; bun/cr ratio ~13   # Circumferential wall thickening of right colon on CT w/o inflammation - diarrheal sx controlled by imodium - no abdominal pain or fever - pt w/o nocturnal awakening - BM preceded by abdominal cramping and improved by defecation - infectious and inflammatory markers negative - suspect element of bile salt diarrhea from cholecystectomy   # chronic diarrhea  # AKI - nsaid use, lack of po intake - being followed by nephrology in hospital    Plan:  Hgb decreasing along with other cell lines in setting of IVF resuscitation with AKI No signs of gib Plan for egd today Npo since midnight Am labs reviewed Continue protonix 40 mg iv q12h Hold dvt ppx Monitor H&H.  Transfusion and resuscitation as per primary team Avoid frequent lab draws to prevent lab induced anemia Supportive care and antiemetics as per primary team Abx not required for GI conditions at this time Maintain two sites IV access Avoid nsaids Monitor for GIB. Recommended to pt to initiate colestipol and keep imodium as prn for increased diarrheal sx Discontinue carafate as has no impact on ulcer healing if present Patient's preference is to perform colonoscopy as an outpatient.  Further recommendations pending endoscopy. Please see op report for further details  Esophagogastroduodenoscopy with possible biopsy, control of bleeding, polypectomy, and interventions as necessary has been discussed with the patient/patient representative. Informed consent was obtained from the patient/patient representative after explaining the indication, nature, and risks of the procedure including but not limited to death, bleeding, perforation, missed neoplasm/lesions, cardiorespiratory compromise, and reaction to medications. Opportunity for questions was given and appropriate answers were provided. Patient/patient representative has verbalized  understanding is amenable to undergoing the procedure.  I personally performed the service.  Management of other medical comorbidities as per primary team  Thank you for allowing [place] to participate in this patient's care. Please don't hesitate to call if any questions or concerns arise.   Annamaria Helling, DO [facility]  Portions of the record may have been created with voice recognition software. Occasional wrong-word or 'sound-a-like' substitutions may have occurred due  to the inherent limitations of voice recognition software.  Read the chart carefully and recognize, using context, where substitutions may have occurred.

## 2022-05-08 NOTE — Discharge Summary (Signed)
Physician Discharge Summary   Patient: Kelsey Short MRN: [id number] DOB: December 15, 1965  Admit date:     05/06/2022  Discharge date: 05/08/22  Discharge Physician: Flora Lipps   PCP: Rusty Aus, MD   Recommendations at discharge:   Follow-up with your primary care provider in 1 week.  Check CBC, BMP magnesium in the next visit  Urinary work-up has been sent from the hospital including autoimmune work-up which is pending at this time for possible nephritis..  Nephrology will need to follow-up in 1 week after discharge. Patient might benefit from urology evaluation as outpatient for overactive bladder and recurrent UTI.  Discharge Diagnoses: Principal Problem:   AKI (acute kidney injury) ([place]) Active Problems:   Right sided colitis   Coffee ground emesis   Normocytic anemia   Essential hypertension   B12 deficiency   Osteoarthritis of knee   Overactive detrusor   Mood disorder (HCC)   E. coli UTI  Resolved Problems:   * No resolved hospital problems. [facility] Course: Patient is a 56 years old female with past medical history of anxiety, GERD, arthritis presented to hospital with abdominal pain nausea fatigue for 3 to 4 days with some loose stools at baseline.  She also had 1 episode of coffee-ground emesis.  Patient was on HCTZ and NSAIDs at home.  Patient was then admitted hospital for acute kidney injury, coffee-ground emesis.  During hospitalization, patient was seen by nephrology and GI.    Following conditions were addressed during hospitalization,  AKI (acute kidney injury) secondary to ATN with CKD stage II. Presented with nausea vomiting.  Baseline creatinine of 0.8.  Creatinine on presentation was 2.8.  Creatinine today at 2.0 from 2.6.  CT scan of the abdomen without any acute findings or hydronephrosis.  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  Discussed about discontinuation of celecoxib and  discharged with continue to monitor BMP.  Urinalysis shows some hematuria and proteinuria.  Urinary work-up has been sent from the hospital including autoimmune work-up which is pending at this time.  Nephrology will need to follow-up in 1 week after discharge.   Right sided colitis as per imaging. History of diarrhea and likley incidental finding on CT scan.  Patient did have nausea vomiting and epigastric pain.  Received Rocephin and Flagyl in hospitalization.  At this time we will transition to Keflex on discharge for E. coli UTI.  E. coli UTI.  Pansensitive antibiotic.  We will change Rocephin to oral Keflex p.o. 3 times daily renally dosed for next 3 days to complete 5-day course.  Coffee ground emesis GI was consulted and patient underwent endoscopic evaluation 05/08/2022 with findings of bilious fluid.   Normocytic anemia Baseline hemoglobin around 11.  Hemoglobin of 9.6 at this time.  Will need to follow-up CBC as outpatient.   Essential hypertension Blood pressure seems to be stable at this time.   B12 deficiency Continue vitamin B12.   History of overactive bladder Continue oxybutynin.  This could be contributing to her recurrent UTI.  Might benefit from urology evaluation as outpatient.  mood disorder. Continue Lexapro  Consultants:  Nephrology GI  Procedures performed: UGI Endoscopy on 05/08/2022  Disposition: Home  Diet recommendation:  Discharge Diet Orders (From admission, onward)     Start     Ordered   05/08/22 0000  Diet general        05/08/22 1518  Regular diet DISCHARGE MEDICATION: Allergies as of 05/08/2022       Reactions   Oxycodone Nausea Only        Medication List     STOP taking these medications    celecoxib 200 MG capsule Commonly known as: CELEBREX   ondansetron 8 MG disintegrating tablet Commonly known as: Zofran ODT   phentermine 30 MG capsule       TAKE these medications    acetaminophen 650 MG CR  tablet Commonly known as: TYLENOL Take 650 mg by mouth 2 (two) times daily as needed for pain.   cephALEXin 500 MG capsule Commonly known as: KEFLEX Take 1 capsule (500 mg total) by mouth 3 (three) times daily for 3 days.   cholecalciferol 25 MCG (1000 UNIT) tablet Commonly known as: VITAMIN D3 Take 1,000 Units by mouth daily.   colestipol 1 g tablet Commonly known as: COLESTID Take 1 g by mouth 2 (two) times daily.   escitalopram 20 MG tablet Commonly known as: LEXAPRO Take 20 mg by mouth every evening.   estradiol 1 MG tablet Commonly known as: ESTRACE Take 1 mg by mouth daily.   HYDROcodone-acetaminophen 5-325 MG tablet Commonly known as: NORCO/VICODIN Take 1 tablet by mouth daily as needed.   Melatonin 10 MG Tabs Take 10 mg by mouth at bedtime.   ondansetron 4 MG tablet Commonly known as: Zofran Take 1 tablet (4 mg total) by mouth every 8 (eight) hours as needed for nausea or vomiting.   oxybutynin 5 MG tablet Commonly known as: DITROPAN Take 1 tablet (5 mg total) by mouth 3 (three) times daily.   pantoprazole 40 MG tablet Commonly known as: PROTONIX Take 40 mg by mouth 2 (two) times daily.   progesterone 100 MG capsule Commonly known as: PROMETRIUM Take 100 mg by mouth daily.   sucralfate 1 g tablet Commonly known as: CARAFATE Take 1 g by mouth 4 (four) times daily.   vitamin B-12 1000 MCG tablet Commonly known as: CYANOCOBALAMIN Take 1,000 mcg by mouth daily.        Follow-up Information     Rusty Aus, MD Follow up in 1 week(s).   Specialty: Internal Medicine Contact information: [address] [place] [address] [phone]         [facility] Follow up in 1 week(s).   Specialty: Nephrology Why: kidney function checkup, blood work followup Contact information: [place] [address] [phone]                 Subjective:  Patient was seen and examined at bedside.  Denies any nausea, vomiting abdominal pain fever chills or rigor.    Discharge Exam: Filed Weights   05/07/22 0402 05/08/22 0500  Weight: 81 kg 79.3 kg      05/08/2022    3:16 PM 05/08/2022    1:29 PM 05/08/2022    1:19 PM  Vitals with BMI  Systolic 947 654 650  Diastolic 71 71 68  Pulse 63 70 65    General:  Average built, not in obvious distress HENT:   No scleral pallor or icterus noted. Oral mucosa is moist.  Chest:  Clear breath sounds.  Diminished breath sounds bilaterally. No crackles or wheezes.  CVS: S1 &S2 heard. No murmur.  Regular rate and rhythm. Abdomen: Soft, nontender, nondistended.  Bowel sounds are heard.   Extremities: No cyanosis, clubbing or edema.  Peripheral pulses are palpable.  Psych: Alert, awake and oriented, normal mood CNS:  No cranial nerve deficits.  Power equal in all extremities.   Skin: Warm and dry.  No rashes noted.  Condition at discharge: good  The results of significant diagnostics from this hospitalization (including imaging, microbiology, ancillary and laboratory) are listed below for reference.   Imaging Studies: CT Renal Stone Study  Result Date: 05/06/2022 CLINICAL DATA:  Recurrent UTIs.  Acute kidney injury. EXAM: CT ABDOMEN AND PELVIS WITHOUT CONTRAST TECHNIQUE: Multidetector CT imaging of the abdomen and pelvis was performed following the standard protocol without IV contrast. RADIATION DOSE REDUCTION: This exam was performed according to the departmental dose-optimization program which includes automated exposure control, adjustment of the mA and/or kV according to patient size and/or use of iterative reconstruction technique. COMPARISON:  CT abdomen pelvis dated November 18, 2019. FINDINGS: Lower chest: No acute abnormality. Hepatobiliary: No focal liver abnormality is seen. Status post cholecystectomy. No biliary dilatation. Pancreas: Unremarkable. No pancreatic ductal dilatation  or surrounding inflammatory changes. Spleen: Normal in size without focal abnormality. Adrenals/Urinary Tract: Adrenal glands are unremarkable. Kidneys are normal, without renal calculi, focal lesion, or hydronephrosis. Bladder is mostly decompressed. Stomach/Bowel: The stomach is within normal limits. Circumferential wall thickening of the cecum and proximal ascending colon with minimal surrounding inflammatory change. No obstruction. Normal appendix. Vascular/Lymphatic: No significant vascular findings are present. No enlarged abdominal or pelvic lymph nodes. Reproductive: IUD appropriately positioned within the uterus. No adnexal mass. Other: No abdominal wall hernia or abnormality. No abdominopelvic ascites. No pneumoperitoneum. Musculoskeletal: No acute or significant osseous findings. IMPRESSION: 1. Circumferential wall thickening of the cecum and proximal ascending colon with minimal surrounding inflammatory change, consistent with colitis. Electronically Signed   By: Titus Dubin M.D.   On: 05/06/2022 13:20    Microbiology: Results for orders placed or performed during the hospital encounter of 05/06/22  Urine Culture     Status: Abnormal   Collection Time: 05/06/22 12:08 PM   Specimen: Urine, Clean Catch  Result Value Ref Range Status   Specimen Description   Final    URINE, CLEAN CATCH Performed at [facility], [address], [place], [place] [address]    Special Requests   Final    NONE Performed at [facility], [address] [place],  [address]    Culture >=100,000 COLONIES/mL ESCHERICHIA COLI (A)  Final   Report Status 05/08/2022 FINAL  Final   Organism ID, Bacteria ESCHERICHIA COLI (A)  Final      Susceptibility   Escherichia coli - MIC*    AMPICILLIN 8 SENSITIVE Sensitive     CEFAZOLIN <=4 SENSITIVE Sensitive     CEFEPIME <=0.12 SENSITIVE Sensitive     CEFTRIAXONE <=0.25 SENSITIVE Sensitive     CIPROFLOXACIN <=0.25 SENSITIVE Sensitive      GENTAMICIN <=1 SENSITIVE Sensitive     IMIPENEM <=0.25 SENSITIVE Sensitive     NITROFURANTOIN <=16 SENSITIVE Sensitive     TRIMETH/SULFA <=20 SENSITIVE Sensitive     AMPICILLIN/SULBACTAM <=2 SENSITIVE Sensitive     PIP/TAZO <=4 SENSITIVE Sensitive     * >=100,000 COLONIES/mL ESCHERICHIA COLI  Gastrointestinal Panel by PCR , Stool     Status: None   Collection Time: 05/06/22  3:40 PM   Specimen: Stool  Result Value Ref Range Status   Campylobacter species NOT DETECTED NOT DETECTED Final   Plesimonas shigelloides NOT DETECTED NOT DETECTED Final   Salmonella species NOT DETECTED NOT DETECTED Final   Yersinia enterocolitica NOT DETECTED NOT DETECTED  Final   Vibrio species NOT DETECTED NOT DETECTED Final   Vibrio cholerae NOT DETECTED NOT DETECTED Final   Enteroaggregative E coli (EAEC) NOT DETECTED NOT DETECTED Final   Enteropathogenic E coli (EPEC) NOT DETECTED NOT DETECTED Final   Enterotoxigenic E coli (ETEC) NOT DETECTED NOT DETECTED Final   Shiga like toxin producing E coli (STEC) NOT DETECTED NOT DETECTED Final   Shigella/Enteroinvasive E coli (EIEC) NOT DETECTED NOT DETECTED Final   Cryptosporidium NOT DETECTED NOT DETECTED Final   Cyclospora cayetanensis NOT DETECTED NOT DETECTED Final   Entamoeba histolytica NOT DETECTED NOT DETECTED Final   Giardia lamblia NOT DETECTED NOT DETECTED Final   Adenovirus F40/41 NOT DETECTED NOT DETECTED Final   Astrovirus NOT DETECTED NOT DETECTED Final   Norovirus GI/GII NOT DETECTED NOT DETECTED Final   Rotavirus A NOT DETECTED NOT DETECTED Final   Sapovirus (I, II, IV, and V) NOT DETECTED NOT DETECTED Final    Comment: Performed at [facility], [address] [place], [place] [address]    Labs: CBC: Recent Labs  Lab 05/06/22 1639 05/07/22 0613 05/08/22 0317  WBC 9.2 6.6 6.0  NEUTROABS 6.3  --   --   HGB 11.2* 10.1* 9.6*  HCT 34.9* 31.4* 30.0*  MCV 95.4 95.4 96.5  PLT 265 253 962   Basic Metabolic Panel: Recent Labs   Lab 05/06/22 1639 05/07/22 0613 05/08/22 0317  NA 139 139 143  K 4.4 3.9 3.9  CL 103 106 110  CO2 '24 25 27  '$ GLUCOSE 88 90 97  BUN 38* 35* 25*  CREATININE 2.88* 2.60* 2.05*  CALCIUM 8.7* 8.2* 8.5*  PHOS  --  4.2  --    Liver Function Tests: Recent Labs  Lab 05/07/22 0613  AST 18  ALT 15  ALKPHOS 56  BILITOT 1.3*  PROT 6.1*  ALBUMIN 3.7   CBG: Recent Labs  Lab 05/07/22 0819 05/08/22 0745  GLUCAP 93 97    Discharge time spent: greater than 30 minutes.  Signed: Flora Lipps, MD [facility] 05/08/2022

## 2022-05-08 NOTE — Transfer of Care (Signed)
Immediate Anesthesia Transfer of Care Note  Patient: Kelsey Short  Procedure(s) Performed: ESOPHAGOGASTRODUODENOSCOPY (EGD) WITH PROPOFOL  Patient Location: PACU  Anesthesia Type:General  Level of Consciousness: awake, alert  and oriented  Airway & Oxygen Therapy: Patient connected to nasal cannula oxygen  Post-op Assessment: Report given to RN and Post -op Vital signs reviewed and stable  Post vital signs: Reviewed and stable  Last Vitals:  Vitals Value Taken Time  BP 122/64 05/08/22 1309  Temp 36.7 C 05/08/22 1309  Pulse 68 05/08/22 1310  Resp 16 05/08/22 1310  SpO2 93 % 05/08/22 1310  Vitals shown include unvalidated device data.  Last Pain:  Vitals:   05/08/22 1309  TempSrc: Temporal  PainSc: Asleep      Patients Stated Pain Goal: 0 (80/99/83 3825)  Complications: No notable events documented.

## 2022-05-08 NOTE — Anesthesia Postprocedure Evaluation (Signed)
Anesthesia Post Note  Patient: Kelsey Short  Procedure(s) Performed: ESOPHAGOGASTRODUODENOSCOPY (EGD) WITH PROPOFOL  Patient location during evaluation: Endoscopy Anesthesia Type: General Level of consciousness: awake and alert Pain management: pain level controlled Vital Signs Assessment: post-procedure vital signs reviewed and stable Respiratory status: spontaneous breathing, nonlabored ventilation, respiratory function stable and patient connected to nasal cannula oxygen Cardiovascular status: blood pressure returned to baseline and stable Postop Assessment: no apparent nausea or vomiting Anesthetic complications: no   No notable events documented.   Last Vitals:  Vitals:   05/08/22 1319 05/08/22 1329  BP: 117/68 120/71  Pulse: 65 70  Resp: 16 14  Temp:    SpO2: 93% 95%    Last Pain:  Vitals:   05/08/22 1329  TempSrc:   PainSc: 0-No pain                 Precious Haws Lachae Hohler

## 2022-05-08 NOTE — TOC Initial Note (Signed)
Transition of Care [facility]) - Initial/Assessment Note    Patient Details  Name: Kelsey Short MRN: [id number] Date of Birth: [birth year]  Transition of Care [facility]) CM/SW Contact:    Beverly Sessions, RN Phone Number: 05/08/2022, 11:52 AM  Clinical Narrative:                  Transition of Care [facility]) Screening Note   Patient Details  Name: Kelsey Short Date of Birth: 1965-11-13   Transition of Care [facility]) CM/SW Contact:    Beverly Sessions, RN Phone Number: 05/08/2022, 11:52 AM    Transition of Care Department [facility]) has reviewed patient and no TOC needs have been identified at this time. We will continue to monitor patient advancement through interdisciplinary progression rounds. If new patient transition needs arise, please place a TOC consult.          Patient Goals and CMS Choice        Expected Discharge Plan and Services                                                Prior Living Arrangements/Services                       Activities of Daily Living Home Assistive Devices/Equipment: None ADL Screening (condition at time of admission) Patient's cognitive ability adequate to safely complete daily activities?: Yes Is the patient deaf or have difficulty hearing?: No Does the patient have difficulty seeing, even when wearing glasses/contacts?: No Does the patient have difficulty concentrating, remembering, or making decisions?: No Patient able to express need for assistance with ADLs?: Yes Does the patient have difficulty dressing or bathing?: No Independently performs ADLs?: Yes (appropriate for developmental age) Does the patient have difficulty walking or climbing stairs?: No Weakness of Legs: Right (due to arthritis in Knee) Weakness of Arms/Hands: None  Permission Sought/Granted                  Emotional Assessment              Admission diagnosis:  AKI (acute kidney injury) ([place]) [N17.9] Urinary tract  infection without hematuria, site unspecified [N39.0] Acute renal failure, unspecified acute renal failure type ([place]) [N17.9] Acute renal failure (ARF) ([place]) [N17.9] Patient Active Problem List   Diagnosis Date Noted   Coffee ground emesis 05/07/2022   Normocytic anemia 05/07/2022   Right sided colitis 05/07/2022   Mood disorder ([place]) 05/07/2022   Essential hypertension 05/07/2022   AKI (acute kidney injury) ([place]) 05/06/2022   Osteoarthritis of knee 03/04/2019   Pain in right knee 01/10/2019   Family hx osteoporosis 09/11/2018   Osteoarthritis of right knee 08/06/2018   DDD (degenerative disc disease), lumbar 08/01/2017   Hematuria, microscopic 07/02/2016   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

## 2022-05-08 NOTE — Progress Notes (Addendum)
Progress Note Patient: Kelsey Short ZDG:[id number] DOB: 10-06-1966 DOA: 05/06/2022  DOS: the patient was seen and examined on 05/08/2022  Brief hospital course: Patient is a 56 years old female with past medical history of anxiety, GERD, arthritis presented to hospital with abdominal pain nausea fatigue for 3 to 4 days with some loose stools at baseline.  She also had 1 episode of coffee-ground emesis.  Patient was on HCTZ and NSAIDs at home.  Patient was then admitted hospital for acute kidney injury, coffee-ground emesis.    During hospitalization, patient was seen by nephrology and GI.  Currently plan for EGD for further evaluation.  Assessment and Plan: Principal Problem:   AKI (acute kidney injury) ([place]) Active Problems:   Right sided colitis   Coffee ground emesis   Normocytic anemia   Essential hypertension   B12 deficiency   Osteoarthritis of knee   Overactive detrusor   Mood disorder (H[facility])   AKI (acute kidney injury) secondary to ATN with CKD stage II. Presenting with nausea vomiting.  Baseline creatinine of 0.8.  Creatinine on presentation was 2.8.  Creatinine today at 2.0 from 2.6..  CT scan of the abdomen without any acute findings or hydronephrosis.  Nephrology was consulted and patient does have history of CKD stage II.Marland Kitchen  Patient was on HCTZ as outpatient as well including on celecoxib.  Continue to hold NSAIDs and HCTZ.  Continue to monitor BMP.  Urinalysis shows some hematuria and proteinuria.  We will follow nephrology recommendation.  Right sided colitis Incidental finding on CT scan.  Patient did have nausea vomiting and epigastric pain.  On Rocephin and Flagyl  Coffee ground emesis GI on board.  For EGD today.  Normocytic anemia Baseline hemoglobin around 11.  Hemoglobin of 9.6 at this time.  Patient is currently n.p.o. for EGD.  Essential hypertension HCTZ currently on hold.  On IV fluids at this time  B12 deficiency Continue vitamin B12.  History of  overactive bladder/mood disorder. On oxybutynin.,  Lexapro   Subjective:  Patient was seen and examined at bedside.  Denies any nausea vomiting abdominal pain fever chills or rigor.    Physical Exam: Vitals:   05/07/22 2004 05/08/22 0500 05/08/22 0512 05/08/22 0748  BP: (!) 124/57  126/70 113/64  Pulse: 64  63 62  Resp: '17  17 17  '$ Temp: 98.7 F (37.1 C)  97.8 F (36.6 C) 97.9 F (36.6 C)  TempSrc:      SpO2: 95%  95% 95%  Weight:  79.3 kg    Height:      General:  Average built, not in obvious distress HENT:   No scleral pallor or icterus noted. Oral mucosa is moist.  Chest:  Clear breath sounds.  Diminished breath sounds bilaterally. No crackles or wheezes.  CVS: S1 &S2 heard. No murmur.  Regular rate and rhythm. Abdomen: Soft, nontender, nondistended.  Bowel sounds are heard.   Extremities: No cyanosis, clubbing or edema.  Peripheral pulses are palpable. Psych: Alert, awake and oriented, normal mood CNS:  No cranial nerve deficits.  Power equal in all extremities.   Skin: Warm and dry.  No rashes noted.   Data Reviewed:    Latest Ref Rng & Units 05/08/2022    3:17 AM 05/07/2022    6:13 AM 05/06/2022    4:39 PM  CBC  WBC 4.0 - 10.5 K/uL 6.0  6.6  9.2   Hemoglobin 12.0 - 15.0 g/dL 9.6  10.1  11.2   Hematocrit 36.0 -  46.0 % 30.0  31.4  34.9   Platelets 150 - 400 K/uL 247  253  265        Latest Ref Rng & Units 05/08/2022    3:17 AM 05/07/2022    6:13 AM 05/06/2022    4:39 PM  BMP  Glucose 70 - 99 mg/dL 97  90  88   BUN 6 - 20 mg/dL 25  35  38   Creatinine 0.44 - 1.00 mg/dL 2.05  2.60  2.88   Sodium 135 - 145 mmol/L 143  139  139   Potassium 3.5 - 5.1 mmol/L 3.9  3.9  4.4   Chloride 98 - 111 mmol/L 110  106  103   CO2 22 - 32 mmol/L '27  25  24   '$ Calcium 8.9 - 10.3 mg/dL 8.5  8.2  8.7      Family Communication: None at bedside.  Consults Nephrology, GI  Procedures None yet.  Disposition: Home  Status is: Inpatient  Remains inpatient appropriate because: IV  antibiotic, IV fluids and plan for EGD today, AKI, pending clinical improvement. Author: Flora Lipps, MD 05/08/2022 10:36 AM

## 2022-05-09 ENCOUNTER — Encounter: Payer: Self-pay | Admitting: Gastroenterology

## 2022-05-09 LAB — UPEP/TP, 24-HR URINE
Albumin, U: 100 %
Alpha 1, Urine: 0 %
Alpha 2, Urine: 0 %
Beta, Urine: 0 %
Gamma Globulin, Urine: 0 %
Total Protein, Urine-Ur/day: 181 mg/24 hr — ABNORMAL HIGH (ref 30–150)
Total Protein, Urine: 8.6 mg/dL
Total Volume: 2100

## 2022-05-09 LAB — PTH, INTACT AND CALCIUM
Calcium, Total (PTH): 8.4 mg/dL — ABNORMAL LOW (ref 8.7–10.2)
PTH: 32 pg/mL (ref 15–65)

## 2022-05-09 LAB — SURGICAL PATHOLOGY

## 2022-05-09 LAB — COMPLEMENT, TOTAL: Compl, Total (CH50): >60 U/mL (ref >41)

## 2022-05-10 LAB — ANCA TITERS
Atypical P-ANCA titer: <1:20 {titer}
C-ANCA: <1:20 {titer}
P-ANCA: <1:20 {titer}

## 2022-05-10 LAB — CYTOLOGY - NON PAP

## 2022-05-12 LAB — MULTIPLE MYELOMA PANEL, SERUM
Albumin SerPl Elph-Mcnc: 3.8 g/dL (ref 2.9–4.4)
Albumin/Glob SerPl: 1.4 (ref 0.7–1.7)
Alpha 1: 0.2 g/dL (ref 0.0–0.4)
Alpha2 Glob SerPl Elph-Mcnc: 0.7 g/dL (ref 0.4–1.0)
B-Globulin SerPl Elph-Mcnc: 0.9 g/dL (ref 0.7–1.3)
Gamma Glob SerPl Elph-Mcnc: 1 g/dL (ref 0.4–1.8)
Globulin, Total: 2.8 g/dL (ref 2.2–3.9)
IgA: 224 mg/dL (ref 87–352)
IgG (Immunoglobin G), Serum: 1179 mg/dL (ref 586–1602)
IgM (Immunoglobulin M), Srm: 48 mg/dL (ref 26–217)
Total Protein ELP: 6.6 g/dL (ref 6.0–8.5)

## 2022-05-18 LAB — MISC LABCORP TEST (SEND OUT): Labcorp test code: 520137

## 2022-06-06 ENCOUNTER — Encounter: Payer: Self-pay | Admitting: Urology

## 2022-06-06 ENCOUNTER — Ambulatory Visit: Payer: BC Managed Care – PPO | Admitting: Urology

## 2022-06-06 VITALS — BP 105/69 | HR 67 | Ht 73.0 in | Wt 171.6 lb

## 2022-06-06 DIAGNOSIS — R300 Dysuria: Secondary | ICD-10-CM | POA: Diagnosis not present

## 2022-06-06 DIAGNOSIS — R3915 Urgency of urination: Secondary | ICD-10-CM | POA: Diagnosis not present

## 2022-06-06 LAB — URINALYSIS, COMPLETE
Bilirubin, UA: NEGATIVE
Glucose, UA: NEGATIVE
Ketones, UA: NEGATIVE
Leukocytes,UA: NEGATIVE
Nitrite, UA: NEGATIVE
Protein,UA: NEGATIVE
Specific Gravity, UA: 1.01 (ref 1.005–1.030)
Urobilinogen, Ur: 0.2 mg/dL (ref 0.2–1.0)
pH, UA: 5.5 (ref 5.0–7.5)

## 2022-06-06 LAB — MICROSCOPIC EXAMINATION

## 2022-06-06 MED ORDER — CEPHALEXIN 500 MG PO CAPS: 500.0000 mg | ORAL_CAPSULE | ORAL | 3 refills | Status: DC | PRN

## 2022-06-06 NOTE — Patient Instructions (Addendum)
Daily probiotics, D-Mannose,cranberry tablets-Take Keflex after intercourse   Cystoscopy Cystoscopy is a procedure that is used to help diagnose and sometimes treat conditions that affect the lower urinary tract. The lower urinary tract includes the bladder and the urethra. The urethra is the tube that drains urine from the bladder. Cystoscopy is done using a thin, tube-shaped instrument with a light and camera at the end (cystoscope). The cystoscope may be hard or flexible, depending on the goal of the procedure. The cystoscope is inserted through the urethra, into the bladder. Cystoscopy may be recommended if you have: Urinary tract infections that keep coming back. Blood in the urine (hematuria). An inability to control when you urinate (urinary incontinence) or an overactive bladder. Unusual cells found in a urine sample. A blockage in the urethra, such as a urinary stone. Painful urination. An abnormality in the bladder found during an intravenous pyelogram (IVP) or CT scan. What are the risks? Generally, this is a safe procedure. However, problems may occur, including: Infection. Bleeding.  What happens during the procedure?  You will be given one or more of the following: A medicine to numb the area (local anesthetic). The area around the opening of your urethra will be cleaned. The cystoscope will be passed through your urethra into your bladder. Germ-free (sterile) fluid will flow through the cystoscope to fill your bladder. The fluid will stretch your bladder so that your health care provider can clearly examine your bladder walls. Your doctor will look at the urethra and bladder. The cystoscope will be removed The procedure may vary among health care providers  What can I expect after the procedure? After the procedure, it is common to have: Some soreness or pain in your urethra. Urinary symptoms. These include: Mild pain or burning when you urinate. Pain should stop within  a few minutes after you urinate. This may last for up to a few days after the procedure. A small amount of blood in your urine for several days. Feeling like you need to urinate but producing only a small amount of urine. Follow these instructions at home: General instructions Return to your normal activities as told by your health care provider.  Drink plenty of fluids after the procedure. Keep all follow-up visits as told by your health care provider. This is important. Contact a health care provider if you: Have pain that gets worse or does not get better with medicine, especially pain when you urinate lasting longer than 72 hours after the procedure. Have trouble urinating. Get help right away if you: Have blood clots in your urine. Have a fever or chills. Are unable to urinate. Summary Cystoscopy is a procedure that is used to help diagnose and sometimes treat conditions that affect the lower urinary tract. Cystoscopy is done using a thin, tube-shaped instrument with a light and camera at the end. After the procedure, it is common to have some soreness or pain in your urethra. It is normal to have blood in your urine after the procedure.  If you were prescribed an antibiotic medicine, take it as told by your health care provider.  This information is not intended to replace advice given to you by your health care provider. Make sure you discuss any questions you have with your health care provider. Document Revised: 10/08/2018 Document Reviewed: 10/08/2018 Elsevier Patient Education  [place].

## 2022-06-06 NOTE — Progress Notes (Signed)
06/06/22 9:28 AM   Kelsey Short 12/01/1965 [id number]  Referring provider:  Rusty Aus, MD [facility] [place],  [place] [address] Chief Complaint  Patient presents with   Dysuria    HPI: Kelsey Short is a 56 y.o.female who presents today for further evaluation of UTI symptoms, urgency, burning, bladder spasms.   She has a personal history of microscopic hematuria ans was last seen her in clinic on 12/10/2019. She underwent a cystoscopy that revealed an approximately 5 mm sessile lesion which is hyperpigmented on the left posterior bladder wall.   She is s/p bladder biopsy on 12/15/2019. Intraoperative findings showed 0.5 cm flat partially transparent hyperpigmented lesion on left posterior bladder wall. Surgical pathology revealed polypoid benign urothelial mucosa with congested bleed vessels within lamina propria, she was negative for atypia and malignancy.   She was recently admitted into the hospital on 05/06/2022 she presented with abdominal pain, nausea, fatigue. Her baseline creatinine is 0.8 Creatinine on presentation was 2.8.  Sh underwent a CT renal stone and it visualized adrenal glands are unremarkable. Kidneys are normal, without renal calculi, focal lesion, or hydronephrosis. Bladder is mostly decompressed. Urine culture grew E.coli that was pansensitive. She was treated with oral keflex 3x daily.   Her creatinine on 05/30/2022 was back to baseline at 0.9.   She's had multiple UTIs this year. She grew E.coli on 12/20/2021. She grew Enterococcus faecalis on 01/06/2022. She grew E.coli on 03/31/2022 and again on 05/06/2022.   She reports that her UTI symptoms consist of urinary frequency, burning, and bladder spasms. She reports that she feels sexual intercourse exacerbates her symptoms.   She has become newly sexually active again over the past year.  She correlates each of her infections with intercourse.  She  also has tenderness and heaviness in her bladder.  She also describes issues with bowel control.  She has been on chronic Imodium since her gallbladder was removed.  She did have a CT scan indicating the presence of colitis with notable circumferential wall thickening of the cecum and proximal ascending colon but with minimal surrounding changes on 05/16/2022.   PMH: Past Medical History:  Diagnosis Date   Anxiety    Arthritis    Complication of anesthesia    GERD (gastroesophageal reflux disease)    PONV (postoperative nausea and vomiting)    NAUSEA    Surgical History: Past Surgical History:  Procedure Laterality Date   CYSTOSCOPY WITH BIOPSY N/A 12/15/2019   Procedure: CYSTOSCOPY WITH BIOPSY;  Surgeon: Hollice Espy, MD;  Location: [facility];  Service: Urology;  Laterality: N/A;   ESOPHAGOGASTRODUODENOSCOPY (EGD) WITH PROPOFOL N/A 05/08/2022   Procedure: ESOPHAGOGASTRODUODENOSCOPY (EGD) WITH PROPOFOL;  Surgeon: Annamaria Helling, DO;  Location: [facility];  Service: Gastroenterology;  Laterality: N/A;   KNEE ARTHROSCOPY WITH MEDIAL MENISECTOMY Right 05/30/2018   Procedure: KNEE ARTHROSCOPY WITH MEDIAL MENISECTOMY PARTIAL SYNOVECTOMY;  Surgeon: Hessie Knows, MD;  Location: [facility];  Service: Orthopedics;  Laterality: Right;   NASAL SINUS SURGERY     THYROID CYST EXCISION     UTERINE FIBROID SURGERY      Home Medications:  Allergies as of 06/06/2022       Reactions   Oxycodone Nausea Only        Medication List        Accurate as of June 06, 2022  9:28 AM. If you have any questions, ask your nurse or doctor.  STOP taking these medications    ondansetron 4 MG tablet Commonly known as: Zofran Stopped by: Hollice Espy, MD   oxybutynin 5 MG tablet Commonly known as: DITROPAN Stopped by: Hollice Espy, MD       TAKE these medications    acetaminophen 650 MG CR tablet Commonly known as: TYLENOL Take 650 mg by mouth 2 (two) times daily as  needed for pain.   cephALEXin 500 MG capsule Commonly known as: Keflex Take 1 capsule (500 mg total) by mouth as needed. Take 1 tablet after intercourse Started by: Hollice Espy, MD   cholecalciferol 25 MCG (1000 UNIT) tablet Commonly known as: VITAMIN D3 Take 1,000 Units by mouth daily.   colestipol 1 g tablet Commonly known as: COLESTID Take 1 g by mouth 2 (two) times daily.   cyanocobalamin 1000 MCG tablet Commonly known as: VITAMIN B12 Take 1,000 mcg by mouth daily.   escitalopram 20 MG tablet Commonly known as: LEXAPRO Take 20 mg by mouth every evening.   estradiol 1 MG tablet Commonly known as: ESTRACE Take 1 mg by mouth daily.   HYDROcodone-acetaminophen 5-325 MG tablet Commonly known as: NORCO/VICODIN Take 1 tablet by mouth daily as needed.   Melatonin 10 MG Tabs Take 10 mg by mouth at bedtime.   ondansetron 4 MG disintegrating tablet Commonly known as: ZOFRAN-ODT Take 4 mg by mouth every 8 (eight) hours as needed for nausea or vomiting.   pantoprazole 40 MG tablet Commonly known as: PROTONIX Take 40 mg by mouth 2 (two) times daily.   progesterone 100 MG capsule Commonly known as: PROMETRIUM Take 100 mg by mouth daily.   sucralfate 1 g tablet Commonly known as: CARAFATE Take 1 g by mouth 4 (four) times daily.        Allergies:  Allergies  Allergen Reactions   Oxycodone Nausea Only    Family History: Family History  Problem Relation Age of Onset   Bladder Cancer Father    Prostate cancer Paternal Uncle    Prostate cancer Paternal Uncle    Prostate cancer Paternal Grandfather     Social History:  reports that she has never smoked. She has never used smokeless tobacco. She reports that she does not drink alcohol and does not use drugs.   Physical Exam: BP 105/69   Pulse 67   Ht '6\' 1"'$  (1.854 m)   Wt 171 lb 9.6 oz (77.8 kg)   BMI 22.64 kg/m   Constitutional:  Alert and oriented, No acute distress. HEENT: [place] AT, moist mucus  membranes.  Trachea midline, no masses. Cardiovascular: No clubbing, cyanosis, or edema. Respiratory: Normal respiratory effort, no increased work of breathing. Skin: No rashes, bruises or suspicious lesions. Neurologic: Grossly intact, no focal deficits, moving all 4 extremities. Psychiatric: Normal mood and affect.  Laboratory Data: Lab Results  Component Value Date   CREATININE 2.05 (H) 05/08/2022   Lab Results  Component Value Date   HGBA1C 4.1 (L) 05/06/2022    Urinalysis 3-10 RBCs   Assessment & Plan:   rUTIs -  Will try her on vaginal estrogen cream, size about 3 times a week per urethral meatus for periurethral integrity - Counseled her in UTI prevention supplements such as cranberry tablets, probiotics, yogurt that has active lactobacillus culture, and d-mannose  - We discussed postcoital antibiotics; she is interested in this today and thus we will try Keflex prior to her just after intercourse as needed -Cystoscopy to rule out underlying fistula or bladder pathology in light of #  2  Microscopic hematuria  - UA positive for hematuria  - We discussed the differential diagnosis for microscopic hematuria including nephrolithiasis, renal or upper tract tumors, bladder stones, UTIs, or bladder tumors as well as undetermined etiologies. - Per AUA guidelines, I did recommend an office cystoscopy with pelvic exam. -We will hold off on further upper tract imaging as she recently had a CT scan stone protocol in July unfortunate without contrast, consider repeat if degree of hematuria worsens    Schedule cystoscopy/pelvic  Conley Rolls as a scribe for Hollice Espy, MD.,have documented all relevant documentation on the behalf of Hollice Espy, MD,as directed by  Hollice Espy, MD while in the presence of Hollice Espy, MD.  I have reviewed the above documentation for accuracy and completeness, and I agree with the above.   Hollice Espy, MD    [facility] [address], [place],  [address] [phone]

## 2022-06-26 NOTE — Progress Notes (Signed)
   06/27/22  CC:  Chief Complaint  Patient presents with   Cysto    HPI: 56 year old female recently seen for further evaluation of recurrent UTIs urgency frequency and bladder spasms.  She also has personal history of microscopic hematuria.  Please see previous notes for details.  She had a recent noncontrast CT scan which was unremarkable.  At the time of last visit, she was started on vaginal estrogen cream 3 times a week as well as postcoital antibiotic in the form of Keflex.  Unfortunately, she did not receive the prescription for vaginal estrogen cream.  Her last dose of antibiotic was Friday.  She has persistent 3-10 red blood cells in her urine today.  She was concerned a few days ago that she had a UTI but she took some Azo and her symptoms resolved.  She presents today for cystoscopy for further evaluation of the above.   NED. A&Ox3.   No respiratory distress   Abd soft, NT, ND Normal external genitalia with patent urethral meatus, slight periurethral atrophy appreciated  Cystoscopy Procedure Note  Patient identification was confirmed, informed consent was obtained, and patient was prepped using Betadine solution.  Lidocaine jelly was administered per urethral meatus.    Procedure: - Flexible cystoscope introduced, without any difficulty.   - Thorough search of the bladder revealed:    normal urethral meatus    normal urothelium with stellate scar at the left lateral bladder wall consistent with previous biopsy    no stones    no ulcers     no tumors    no urethral polyps    no trabeculation  - Ureteral orifices were normal in position and appearance.  Post-Procedure: - Patient tolerated the procedure well  Assessment/ Plan:  1. Recurrent UTI Cystoscopy today is unremarkable, reassuring  Agree with continuation of postcoital antibiotics, okay to take a dose today after cystoscopy  --> may be able to stop postcoital antibiotics after she has used Premarin  periurethrally consistently  Prescription for Premarin cream sent to pharmacy, discussed 3 times a week   2. Microscopic hematuria Persistent today  Negative CT urogram in 2021 as well as negative stone protocol on 04/2022, will defer additional upper tract imaging currently but if the degree of hematuria worsens, may consider repeat CT urogram   Follow in 6 months for UA/symptom recheck  Hollice Espy, MD

## 2022-06-27 ENCOUNTER — Ambulatory Visit: Payer: BC Managed Care – PPO | Admitting: Urology

## 2022-06-27 DIAGNOSIS — N390 Urinary tract infection, site not specified: Secondary | ICD-10-CM

## 2022-06-27 DIAGNOSIS — R3129 Other microscopic hematuria: Secondary | ICD-10-CM | POA: Diagnosis not present

## 2022-06-27 DIAGNOSIS — Z87440 Personal history of urinary (tract) infections: Secondary | ICD-10-CM

## 2022-06-27 LAB — URINALYSIS, COMPLETE
Bilirubin, UA: NEGATIVE
Glucose, UA: NEGATIVE
Ketones, UA: NEGATIVE
Leukocytes,UA: NEGATIVE
Nitrite, UA: NEGATIVE
Protein,UA: NEGATIVE
Specific Gravity, UA: 1.01 (ref 1.005–1.030)
Urobilinogen, Ur: 0.2 mg/dL (ref 0.2–1.0)
pH, UA: 5.5 (ref 5.0–7.5)

## 2022-06-27 LAB — MICROSCOPIC EXAMINATION

## 2022-06-27 MED ORDER — ESTRADIOL 0.1 MG/GM VA CREA: TOPICAL_CREAM | VAGINAL | 12 refills | Status: DC

## 2022-07-05 ENCOUNTER — Other Ambulatory Visit: Payer: Self-pay | Admitting: Physical Medicine and Rehabilitation

## 2022-07-05 DIAGNOSIS — M5416 Radiculopathy, lumbar region: Secondary | ICD-10-CM

## 2022-07-19 ENCOUNTER — Ambulatory Visit
Admission: RE | Admit: 2022-07-19 | Discharge: 2022-07-19 | Disposition: A | Discharge status: Home / Self Care | Payer: BC Managed Care – PPO | Source: Ambulatory Visit | Attending: Physical Medicine and Rehabilitation | Admitting: Physical Medicine and Rehabilitation

## 2022-07-19 DIAGNOSIS — M5416 Radiculopathy, lumbar region: Secondary | ICD-10-CM

## 2022-10-11 ENCOUNTER — Other Ambulatory Visit: Payer: Self-pay | Admitting: Internal Medicine

## 2022-10-11 DIAGNOSIS — R7401 Elevation of levels of liver transaminase levels: Secondary | ICD-10-CM

## 2022-10-11 DIAGNOSIS — R1011 Right upper quadrant pain: Secondary | ICD-10-CM

## 2022-11-02 ENCOUNTER — Inpatient Hospital Stay: Admission: RE | Admit: 2022-11-02 | Payer: BC Managed Care – PPO | Source: Ambulatory Visit

## 2022-11-14 ENCOUNTER — Inpatient Hospital Stay: Admission: RE | Admit: 2022-11-14 | Payer: BC Managed Care – PPO | Source: Ambulatory Visit

## 2022-12-28 ENCOUNTER — Ambulatory Visit: Payer: BC Managed Care – PPO | Admitting: Physician Assistant

## 2023-04-23 NOTE — Progress Notes (Unsigned)
Referring Physician:  Danella Penton, MD [address] [facility] [address] [facility] [place],  [place] [address]  Primary Physician:  Danella Penton, MD  History of Present Illness: 04/24/2023 Ms. Kelsey Short is here today with a chief complaint of several years of low back pain.  She gets pain into her right side of her lower back with activity such as bending, lifting, sitting, and standing.  Driving long distances also caused her to have pain.  She is also having pain into her right buttock, posterior thigh, right groin, medial thigh.  She has some pain down her medial calf to her ankle.  She has some slight weakness and sometimes has trouble picking up her right foot. More recently, she does have a small amount of left-sided leg pain, but her symptoms are primarily on the right side.  She has a long history of injections as noted below.  She has usually gotten excellent relief from her injections.  Bowel/Bladder Dysfunction: none  Conservative measures:  Physical therapy: currently participating at [facility] since 02/28/23   Multimodal medical therapy including regular antiinflammatories:  Naproxen, Tylenol, Mobic, Norco, prednisone Injections: has received epidural steroid injections 01/23/2023: Right L5-S1 transforaminal ESI (over 50% relief, dexamethasone 12 mg) 07/27/2022: Right L5-S1 and right L4-5 transforaminal ESI (moderate relief, less effective) 04/19/2022: Right L5-S1 transforaminal ESI (moderate to good relief) 12/15/2021: Right L5-S1 transforaminal ESI (moderate to good relief)  01/01/2018: Left L5-S1 transforaminal ESI (good relief) 12/04/2017: Left L5-S1 transforaminal ESI (moderate relief) 02/09/2016: Right L5-S1 transforaminal ESI (good relief) 09/15/2015: Right L5-S1 transforaminal ESI (good relief) 02/27/2014: Right L5-S1 transforaminal ESI (good relief) 12/11/2013: Right L5-S1 transforaminal ESI   Past Surgery: no previous spine  surgery  Kelsey Short has no symptoms of cervical myelopathy.  The symptoms are causing a significant impact on the patient's life.   I have utilized the care everywhere function in epic to review the outside records available from external health systems.  Review of Systems:  A 10 point review of systems is negative, except for the pertinent positives and negatives detailed in the HPI.  Past Medical History: Past Medical History:  Diagnosis Date   Anxiety    Arthritis    Complication of anesthesia    GERD (gastroesophageal reflux disease)    PONV (postoperative nausea and vomiting)    NAUSEA    Past Surgical History: Past Surgical History:  Procedure Laterality Date   CYSTOSCOPY WITH BIOPSY N/A 12/15/2019   Procedure: CYSTOSCOPY WITH BIOPSY;  Surgeon: Vanna Scotland, MD;  Location: [facility] ORS;  Service: Urology;  Laterality: N/A;   ESOPHAGOGASTRODUODENOSCOPY (EGD) WITH PROPOFOL N/A 05/08/2022   Procedure: ESOPHAGOGASTRODUODENOSCOPY (EGD) WITH PROPOFOL;  Surgeon: Jaynie Collins, DO;  Location: [facility] ENDOSCOPY;  Service: Gastroenterology;  Laterality: N/A;   KNEE ARTHROSCOPY WITH MEDIAL MENISECTOMY Right 05/30/2018   Procedure: KNEE ARTHROSCOPY WITH MEDIAL MENISECTOMY PARTIAL SYNOVECTOMY;  Surgeon: Kennedy Bucker, MD;  Location: [facility] ORS;  Service: Orthopedics;  Laterality: Right;   NASAL SINUS SURGERY     THYROID CYST EXCISION     UTERINE FIBROID SURGERY      Allergies: Allergies as of 04/24/2023 - Review Complete 06/06/2022  Allergen Reaction Noted   Oxycodone Nausea Only 10/08/2014    Medications:  Current Outpatient Medications:    acetaminophen (TYLENOL) 650 MG CR tablet, Take 650 mg by mouth 2 (two) times daily as needed for pain., Disp: , Rfl:    cephALEXin (KEFLEX) 500 MG capsule, Take 1 capsule (500 mg  total) by mouth as needed. Take 1 tablet after intercourse, Disp: 30 capsule, Rfl: 3   cholecalciferol (VITAMIN D3) 25 MCG (1000 UNIT) tablet, Take 1,000  Units by mouth daily., Disp: , Rfl:    colestipol (COLESTID) 1 g tablet, Take 1 g by mouth 2 (two) times daily. (Patient not taking: Reported on 06/06/2022), Disp: , Rfl:    escitalopram (LEXAPRO) 20 MG tablet, Take 20 mg by mouth every evening., Disp: , Rfl:    estradiol (ESTRACE) 0.1 MG/GM vaginal cream, Estrogen Cream Instruction Discard applicator Apply pea sized amount to tip of finger to urethra before bed. Wash hands well after application. Use Monday, Wednesday and Friday, Disp: 42.5 g, Rfl: 12   estradiol (ESTRACE) 1 MG tablet, Take 1 mg by mouth daily., Disp: , Rfl:    HYDROcodone-acetaminophen (NORCO/VICODIN) 5-325 MG tablet, Take 1 tablet by mouth daily as needed., Disp: , Rfl:    Melatonin 10 MG TABS, Take 10 mg by mouth at bedtime., Disp: , Rfl:    ondansetron (ZOFRAN-ODT) 4 MG disintegrating tablet, Take 4 mg by mouth every 8 (eight) hours as needed for nausea or vomiting., Disp: , Rfl:    pantoprazole (PROTONIX) 40 MG tablet, Take 40 mg by mouth 2 (two) times daily., Disp: , Rfl:    progesterone (PROMETRIUM) 100 MG capsule, Take 100 mg by mouth daily., Disp: , Rfl:    sucralfate (CARAFATE) 1 g tablet, Take 1 g by mouth 4 (four) times daily., Disp: , Rfl:    vitamin B-12 (CYANOCOBALAMIN) 1000 MCG tablet, Take 1,000 mcg by mouth daily., Disp: , Rfl:   Social History: Social History   Tobacco Use   Smoking status: Never   Smokeless tobacco: Never  Vaping Use   Vaping Use: Never used  Substance Use Topics   Alcohol use: Never   Drug use: Never    Family Medical History: Family History  Problem Relation Age of Onset   Bladder Cancer Father    Prostate cancer Paternal Uncle    Prostate cancer Paternal Uncle    Prostate cancer Paternal Grandfather     Physical Examination: Vitals:   04/24/23 1303  BP: 110/72    General: Patient is in no apparent distress. Attention to examination is appropriate.  Neck:   Supple.  Full range of motion.  Respiratory: Patient is  breathing without any difficulty.   NEUROLOGICAL:     Awake, alert, oriented to person, place, and time.  Speech is clear and fluent.   Cranial Nerves: Pupils equal round and reactive to light.  Facial tone is symmetric.  Facial sensation is symmetric. Shoulder shrug is symmetric. Tongue protrusion is midline.  There is no pronator drift.  Strength: Side Biceps Triceps Deltoid Interossei Grip Wrist Ext. Wrist Flex.  R 5 5 5 5 5 5 5   L 5 5 5 5 5 5 5    Side Iliopsoas Quads Hamstring PF DF EHL  R 5 5 5 5 5 5   L 5 5 5 5 5 5    Reflexes are 1+ and symmetric at the biceps, triceps, brachioradialis, patella and achilles.   Hoffman's is absent.   Bilateral upper and lower extremity sensation is intact to light touch.    No evidence of dysmetria noted.  Gait is normal.    SLR positive at ~80 degrees bilaterally with back discomfort  +TTP R SI joint   Medical Decision Making  Imaging: MRI L spine 07/21/2022 IMPRESSION: 1. Lumbar spine degeneration with progression at L2-3 to L4-5  since 2016. 2. L4-5 left subarticular recess stenosis impinging on the descending left L5 nerve root. 3. L3-4 small left paracentral protrusion crowding the left L4 nerve root. 4. No compressive stenosis seen on the symptomatic right side.     Electronically Signed   By: Tiburcio Pea M.D.   On: 07/21/2022 07:15  I have personally reviewed the images and agree with the above interpretation.  Assessment and Plan: Ms. Remus is a pleasant 57 y.o. female with right leg pain that could be secondary to L4 mediated dysfunction.  She has minimal left leg pain.  She has some symptoms concerning for sacroiliitis on the right side.  I would like to touch base with her injection specialist regarding the possibility of right-sided sacroiliac joint.  If this is successful, she can have a second diagnostic injection that may give her candidacy for an SI joint fusion.  If she is not improved with this injection,  we will consider repeating her MRI scan and getting a nerve conduction study, as there is not a good explanation for her right leg symptoms on her prior MRI scan from last year.  We will follow-up with a telephone visit 2 weeks after her injection.  Thank you for involving me in the care of this patient.      Asia Favata K. Myer Haff MD, [facility] Neurosurgery

## 2023-04-24 ENCOUNTER — Encounter: Payer: Self-pay | Admitting: Neurosurgery

## 2023-04-24 ENCOUNTER — Ambulatory Visit: Payer: BC Managed Care – PPO | Admitting: Neurosurgery

## 2023-04-24 VITALS — BP 110/72 | Ht 74.0 in | Wt 193.8 lb

## 2023-04-24 DIAGNOSIS — M79604 Pain in right leg: Secondary | ICD-10-CM | POA: Diagnosis not present

## 2023-04-24 DIAGNOSIS — M533 Sacrococcygeal disorders, not elsewhere classified: Secondary | ICD-10-CM | POA: Diagnosis not present

## 2023-05-22 ENCOUNTER — Ambulatory Visit (INDEPENDENT_AMBULATORY_CARE_PROVIDER_SITE_OTHER): Payer: BC Managed Care – PPO | Admitting: Neurosurgery

## 2023-05-22 DIAGNOSIS — M533 Sacrococcygeal disorders, not elsewhere classified: Secondary | ICD-10-CM

## 2023-05-22 DIAGNOSIS — M79604 Pain in right leg: Secondary | ICD-10-CM

## 2023-05-22 NOTE — Progress Notes (Signed)
Referring Physician:  Danella Penton, MD [address] [facility] [address] [facility] [place],  [place] [address]  Primary Physician:  Danella Penton, MD  History of Present Illness: 05/22/2023 Kelsey Short returns to see me.  She had an ESI on July 9, which helped approximately 50%.  She has discussed with Dr. Yves Dill about doing a R SI joint injection.     04/24/2023 Kelsey Short is here today with a chief complaint of several years of low back pain.  She gets pain into her right side of her lower back with activity such as bending, lifting, sitting, and standing.  Driving long distances also caused her to have pain.  She is also having pain into her right buttock, posterior thigh, right groin, medial thigh.  She has some pain down her medial calf to her ankle.  She has some slight weakness and sometimes has trouble picking up her right foot. More recently, she does have a small amount of left-sided leg pain, but her symptoms are primarily on the right side.  She has a long history of injections as noted below.  She has usually gotten excellent relief from her injections.  Bowel/Bladder Dysfunction: none  Conservative measures:  Physical therapy: currently participating at [facility] since 02/28/23   Multimodal medical therapy including regular antiinflammatories:  Naproxen, Tylenol, Mobic, Norco, prednisone Injections: has received epidural steroid injections 01/23/2023: Right L5-S1 transforaminal ESI (over 50% relief, dexamethasone 12 mg) 07/27/2022: Right L5-S1 and right L4-5 transforaminal ESI (moderate relief, less effective) 04/19/2022: Right L5-S1 transforaminal ESI (moderate to good relief) 12/15/2021: Right L5-S1 transforaminal ESI (moderate to good relief)  01/01/2018: Left L5-S1 transforaminal ESI (good relief) 12/04/2017: Left L5-S1 transforaminal ESI (moderate relief) 02/09/2016: Right L5-S1 transforaminal ESI (good relief) 09/15/2015: Right L5-S1  transforaminal ESI (good relief) 02/27/2014: Right L5-S1 transforaminal ESI (good relief) 12/11/2013: Right L5-S1 transforaminal ESI   Past Surgery: no previous spine surgery  Kelsey Short has no symptoms of cervical myelopathy.  The symptoms are causing a significant impact on the patient's life.   I have utilized the care everywhere function in epic to review the outside records available from external health systems.  Review of Systems:  A 10 point review of systems is negative, except for the pertinent positives and negatives detailed in the HPI.  Past Medical History: Past Medical History:  Diagnosis Date   Anxiety    Arthritis    B12 deficiency    Complication of anesthesia    GERD (gastroesophageal reflux disease)    PONV (postoperative nausea and vomiting)    NAUSEA    Past Surgical History: Past Surgical History:  Procedure Laterality Date   CYSTOSCOPY WITH BIOPSY N/A 12/15/2019   Procedure: CYSTOSCOPY WITH BIOPSY;  Surgeon: Vanna Scotland, MD;  Location: [facility] ORS;  Service: Urology;  Laterality: N/A;   ESOPHAGOGASTRODUODENOSCOPY (EGD) WITH PROPOFOL N/A 05/08/2022   Procedure: ESOPHAGOGASTRODUODENOSCOPY (EGD) WITH PROPOFOL;  Surgeon: Jaynie Collins, DO;  Location: [facility] ENDOSCOPY;  Service: Gastroenterology;  Laterality: N/A;   KNEE ARTHROSCOPY WITH MEDIAL MENISECTOMY Right 05/30/2018   Procedure: KNEE ARTHROSCOPY WITH MEDIAL MENISECTOMY PARTIAL SYNOVECTOMY;  Surgeon: Kennedy Bucker, MD;  Location: [facility] ORS;  Service: Orthopedics;  Laterality: Right;   NASAL SINUS SURGERY     THYROID CYST EXCISION     UTERINE FIBROID SURGERY      Allergies: Allergies as of 05/22/2023 - Review Complete 04/24/2023  Allergen Reaction Noted   Oxycodone Nausea Only 10/08/2014    Medications:  Current Outpatient Medications:    acetaminophen (TYLENOL) 650 MG CR tablet, Take 650 mg by mouth 2 (two) times daily as needed for pain., Disp: , Rfl:    cephALEXin (KEFLEX)  500 MG capsule, Take 1 capsule (500 mg total) by mouth as needed. Take 1 tablet after intercourse, Disp: 30 capsule, Rfl: 3   cholecalciferol (VITAMIN D3) 25 MCG (1000 UNIT) tablet, Take 1,000 Units by mouth daily., Disp: , Rfl:    colestipol (COLESTID) 1 g tablet, Take 1 g by mouth 2 (two) times daily., Disp: , Rfl:    escitalopram (LEXAPRO) 20 MG tablet, Take 20 mg by mouth every evening., Disp: , Rfl:    hydrochlorothiazide (HYDRODIURIL) 25 MG tablet, Take 25 mg by mouth daily as needed (edema)., Disp: , Rfl:    HYDROcodone-acetaminophen (NORCO/VICODIN) 5-325 MG tablet, Take 1 tablet by mouth daily as needed., Disp: , Rfl:    Melatonin 10 MG TABS, Take 10 mg by mouth at bedtime., Disp: , Rfl:    Meth-Hyo-M Bl-Na Phos-Ph Sal (URIBEL) 118 MG CAPS, Take 1 capsule by mouth 2 (two) times daily., Disp: , Rfl:    pantoprazole (PROTONIX) 40 MG tablet, Take 40 mg by mouth 2 (two) times daily., Disp: , Rfl:    phentermine 15 MG capsule, Take 15 mg by mouth every other day., Disp: , Rfl:    vitamin B-12 (CYANOCOBALAMIN) 1000 MCG tablet, Take 1,000 mcg by mouth daily., Disp: , Rfl:   Social History: Social History   Tobacco Use   Smoking status: Never   Smokeless tobacco: Never  Vaping Use   Vaping status: Never Used  Substance Use Topics   Alcohol use: Never   Drug use: Never    Family Medical History: Family History  Problem Relation Age of Onset   Bladder Cancer Father    Prostate cancer Paternal Uncle    Prostate cancer Paternal Uncle    Prostate cancer Paternal Grandfather     Physical Examination: Telephone visit today   Medical Decision Making  Imaging: MRI L spine 07/21/2022 IMPRESSION: 1. Lumbar spine degeneration with progression at L2-3 to L4-5 since 2016. 2. L4-5 left subarticular recess stenosis impinging on the descending left L5 nerve root. 3. L3-4 small left paracentral protrusion crowding the left L4 nerve root. 4. No compressive stenosis seen on the  symptomatic right side.     Electronically Signed   By: Tiburcio Pea M.D.   On: 07/21/2022 07:15  I have personally reviewed the images and agree with the above interpretation.  Assessment and Plan: Kelsey Short is a pleasant 58 y.o. female with right leg pain that could be secondary to L4 mediated dysfunction.  She has minimal left leg pain.  She has some symptoms concerning for sacroiliitis on the right side.   She is going to call Dr. Margaretann Loveless office to set up the sacroiliac joint injection on the right.  If that is helpful, she will ultimately need a confirmatory injection to meet criteria for considering an SI joint fusion.  If it is not helpful, we will reimage her lumbar spine to see whether something is changed that might explain her symptoms.  She is going to contact my office 2 weeks after her injection and we will provide further guidance from there.  This visit was performed via telephone.  Patient location: home Provider location: office  I spent a total of 5 minutes non-face-to-face activities for this visit on the date of this encounter including review of current clinical condition  and response to treatment.  The patient is aware of and accepts the limits of this telehealth visit.      Shraddha Lebron K. Myer Haff MD, [facility] Neurosurgery

## 2023-08-27 ENCOUNTER — Other Ambulatory Visit: Payer: Self-pay | Admitting: Family Medicine

## 2023-08-27 DIAGNOSIS — M5416 Radiculopathy, lumbar region: Secondary | ICD-10-CM

## 2023-09-06 ENCOUNTER — Other Ambulatory Visit: Payer: Self-pay | Admitting: Orthopedic Surgery

## 2023-09-06 DIAGNOSIS — M1711 Unilateral primary osteoarthritis, right knee: Secondary | ICD-10-CM

## 2023-09-10 ENCOUNTER — Other Ambulatory Visit: Payer: Self-pay | Admitting: Urology

## 2023-09-10 NOTE — Telephone Encounter (Signed)
Patient cancelled her f/u appt that was scheduled for 11/2022 for UA and sx recheck, patient has not called back to r/s. Does patient need a follow up appt with a PA prior to refill?

## 2023-09-18 ENCOUNTER — Telehealth: Payer: Self-pay | Admitting: Urology

## 2023-09-18 NOTE — Telephone Encounter (Signed)
Patient dropped in office and requested refill for Cephalexin (she had 2 refills, but it ran out in August). Patient has not been seen since 06/27/22. Can she get refill? She said can't schedule an appointment until January.  Pharmacy is [facility]. Please advise patient.

## 2023-09-18 NOTE — Telephone Encounter (Signed)
As per refill request phone note patient needs an appointment with a PA. Dr Apolinar Junes can patient get 1 refill and make an appointment for January?

## 2023-09-19 MED ORDER — CEPHALEXIN 500 MG PO CAPS: 500.0000 mg | ORAL_CAPSULE | ORAL | 0 refills | Status: AC | PRN

## 2023-09-19 NOTE — Telephone Encounter (Signed)
Yes, ok to refill x 1.  Vanna Scotland, MD

## 2023-09-19 NOTE — Telephone Encounter (Signed)
Left detailed message letting patient know that we are able to provide refill 1 time and to call back to schedule f.u with [place] for January and it can be with one of the PAs.

## 2023-09-24 ENCOUNTER — Encounter: Payer: Self-pay | Admitting: Orthopedic Surgery

## 2023-09-25 ENCOUNTER — Inpatient Hospital Stay: Admission: RE | Admit: 2023-09-25 | Payer: BC Managed Care – PPO | Source: Ambulatory Visit
